# Patient Record
Sex: Female | Born: 1980 | Race: Black or African American | Hispanic: No | Marital: Single | Smoking: Never smoker
Health system: Southern US, Community
[De-identification: ages and names within clinical notes are randomized; demographics above are authoritative.]

## PROBLEM LIST (undated history)

## (undated) ENCOUNTER — Inpatient Hospital Stay (HOSPITAL_COMMUNITY): Payer: Self-pay

## (undated) DIAGNOSIS — G4730 Sleep apnea, unspecified: Secondary | ICD-10-CM

## (undated) DIAGNOSIS — E282 Polycystic ovarian syndrome: Secondary | ICD-10-CM

## (undated) DIAGNOSIS — O24419 Gestational diabetes mellitus in pregnancy, unspecified control: Secondary | ICD-10-CM

## (undated) DIAGNOSIS — K589 Irritable bowel syndrome without diarrhea: Secondary | ICD-10-CM

## (undated) DIAGNOSIS — O139 Gestational [pregnancy-induced] hypertension without significant proteinuria, unspecified trimester: Secondary | ICD-10-CM

## (undated) HISTORY — PX: DILATION AND CURETTAGE OF UTERUS: SHX78

---

## 2000-01-15 ENCOUNTER — Emergency Department (HOSPITAL_COMMUNITY): Admission: EM | Admit: 2000-01-15 | Discharge: 2000-01-15 | Payer: Self-pay | Admitting: Emergency Medicine

## 2001-06-21 ENCOUNTER — Emergency Department (HOSPITAL_COMMUNITY): Admission: EM | Admit: 2001-06-21 | Discharge: 2001-06-21 | Payer: Self-pay | Admitting: Emergency Medicine

## 2003-11-05 ENCOUNTER — Emergency Department (HOSPITAL_COMMUNITY): Admission: EM | Admit: 2003-11-05 | Discharge: 2003-11-05 | Payer: Self-pay | Admitting: Emergency Medicine

## 2004-01-12 ENCOUNTER — Emergency Department (HOSPITAL_COMMUNITY): Admission: EM | Admit: 2004-01-12 | Discharge: 2004-01-12 | Payer: Self-pay | Admitting: Emergency Medicine

## 2004-01-12 ENCOUNTER — Encounter: Payer: Self-pay | Admitting: Obstetrics

## 2004-02-24 ENCOUNTER — Emergency Department (HOSPITAL_COMMUNITY): Admission: EM | Admit: 2004-02-24 | Discharge: 2004-02-24 | Payer: Self-pay | Admitting: Emergency Medicine

## 2004-03-23 ENCOUNTER — Ambulatory Visit (HOSPITAL_COMMUNITY): Admission: RE | Admit: 2004-03-23 | Discharge: 2004-03-23 | Payer: Self-pay | Admitting: Obstetrics & Gynecology

## 2004-04-13 ENCOUNTER — Observation Stay (HOSPITAL_COMMUNITY): Admission: AD | Admit: 2004-04-13 | Discharge: 2004-04-14 | Payer: Self-pay | Admitting: Obstetrics & Gynecology

## 2004-05-23 ENCOUNTER — Inpatient Hospital Stay (HOSPITAL_COMMUNITY): Admission: AD | Admit: 2004-05-23 | Discharge: 2004-05-24 | Payer: Self-pay | Admitting: Obstetrics & Gynecology

## 2004-07-25 ENCOUNTER — Inpatient Hospital Stay (HOSPITAL_COMMUNITY): Admission: AD | Admit: 2004-07-25 | Discharge: 2004-07-25 | Payer: Self-pay | Admitting: Obstetrics

## 2004-08-04 ENCOUNTER — Inpatient Hospital Stay (HOSPITAL_COMMUNITY): Admission: AD | Admit: 2004-08-04 | Discharge: 2004-08-06 | Payer: Self-pay | Admitting: Obstetrics & Gynecology

## 2004-08-09 ENCOUNTER — Inpatient Hospital Stay (HOSPITAL_COMMUNITY): Admission: AD | Admit: 2004-08-09 | Discharge: 2004-08-19 | Payer: Self-pay | Admitting: Obstetrics & Gynecology

## 2004-08-10 ENCOUNTER — Encounter (INDEPENDENT_AMBULATORY_CARE_PROVIDER_SITE_OTHER): Payer: Self-pay | Admitting: Specialist

## 2006-07-18 ENCOUNTER — Inpatient Hospital Stay (HOSPITAL_COMMUNITY): Admission: AD | Admit: 2006-07-18 | Discharge: 2006-07-18 | Payer: Self-pay | Admitting: *Deleted

## 2006-07-20 ENCOUNTER — Ambulatory Visit (HOSPITAL_COMMUNITY): Admission: AD | Admit: 2006-07-20 | Discharge: 2006-07-20 | Payer: Self-pay | Admitting: Obstetrics and Gynecology

## 2006-07-20 ENCOUNTER — Encounter (INDEPENDENT_AMBULATORY_CARE_PROVIDER_SITE_OTHER): Payer: Self-pay | Admitting: Specialist

## 2007-03-01 ENCOUNTER — Emergency Department (HOSPITAL_COMMUNITY): Admission: EM | Admit: 2007-03-01 | Discharge: 2007-03-01 | Payer: Self-pay | Admitting: Emergency Medicine

## 2007-07-08 ENCOUNTER — Emergency Department (HOSPITAL_COMMUNITY): Admission: EM | Admit: 2007-07-08 | Discharge: 2007-07-08 | Payer: Self-pay | Admitting: Emergency Medicine

## 2007-10-22 ENCOUNTER — Inpatient Hospital Stay (HOSPITAL_COMMUNITY): Admission: AD | Admit: 2007-10-22 | Discharge: 2007-10-22 | Payer: Self-pay | Admitting: Obstetrics & Gynecology

## 2009-01-26 ENCOUNTER — Emergency Department (HOSPITAL_COMMUNITY): Admission: EM | Admit: 2009-01-26 | Discharge: 2009-01-26 | Payer: Self-pay | Admitting: Emergency Medicine

## 2009-02-20 ENCOUNTER — Ambulatory Visit: Payer: Self-pay | Admitting: Advanced Practice Midwife

## 2009-02-20 ENCOUNTER — Inpatient Hospital Stay (HOSPITAL_COMMUNITY): Admission: AD | Admit: 2009-02-20 | Discharge: 2009-02-20 | Payer: Self-pay | Admitting: Obstetrics & Gynecology

## 2009-02-27 ENCOUNTER — Ambulatory Visit (HOSPITAL_COMMUNITY): Admission: RE | Admit: 2009-02-27 | Discharge: 2009-02-27 | Payer: Self-pay | Admitting: Obstetrics & Gynecology

## 2009-03-02 ENCOUNTER — Inpatient Hospital Stay (HOSPITAL_COMMUNITY): Admission: AD | Admit: 2009-03-02 | Discharge: 2009-03-02 | Payer: Self-pay | Admitting: Obstetrics and Gynecology

## 2009-05-25 ENCOUNTER — Inpatient Hospital Stay (HOSPITAL_COMMUNITY): Admission: AD | Admit: 2009-05-25 | Discharge: 2009-05-25 | Payer: Self-pay | Admitting: Obstetrics & Gynecology

## 2009-07-10 ENCOUNTER — Emergency Department (HOSPITAL_COMMUNITY): Admission: AC | Admit: 2009-07-10 | Discharge: 2009-07-10 | Payer: Self-pay

## 2009-07-13 ENCOUNTER — Inpatient Hospital Stay (HOSPITAL_COMMUNITY): Admission: AD | Admit: 2009-07-13 | Discharge: 2009-07-13 | Payer: Self-pay | Admitting: Obstetrics and Gynecology

## 2009-07-22 ENCOUNTER — Encounter: Admission: RE | Admit: 2009-07-22 | Discharge: 2009-10-20 | Payer: Self-pay | Admitting: Obstetrics and Gynecology

## 2009-08-11 ENCOUNTER — Encounter: Admission: RE | Admit: 2009-08-11 | Discharge: 2009-09-10 | Payer: Self-pay | Admitting: Obstetrics and Gynecology

## 2009-08-28 ENCOUNTER — Inpatient Hospital Stay (HOSPITAL_COMMUNITY): Admission: AD | Admit: 2009-08-28 | Discharge: 2009-08-28 | Payer: Self-pay | Admitting: Obstetrics and Gynecology

## 2009-08-29 ENCOUNTER — Emergency Department (HOSPITAL_COMMUNITY): Admission: EM | Admit: 2009-08-29 | Discharge: 2009-08-29 | Payer: Self-pay | Admitting: Emergency Medicine

## 2009-10-01 ENCOUNTER — Inpatient Hospital Stay (HOSPITAL_COMMUNITY): Admission: AD | Admit: 2009-10-01 | Discharge: 2009-10-01 | Payer: Self-pay | Admitting: Obstetrics and Gynecology

## 2009-10-13 ENCOUNTER — Inpatient Hospital Stay (HOSPITAL_COMMUNITY): Admission: RE | Admit: 2009-10-13 | Discharge: 2009-10-16 | Payer: Self-pay | Admitting: Obstetrics and Gynecology

## 2010-06-05 LAB — CBC
Hemoglobin: 9.6 g/dL — ABNORMAL LOW (ref 12.0–15.0)
MCH: 27.4 pg (ref 26.0–34.0)
MCHC: 32.8 g/dL (ref 30.0–36.0)
Platelets: 202 10*3/uL (ref 150–400)
RBC: 4.17 MIL/uL (ref 3.87–5.11)
RDW: 15.9 % — ABNORMAL HIGH (ref 11.5–15.5)
WBC: 5.4 10*3/uL (ref 4.0–10.5)

## 2010-06-05 LAB — GLUCOSE, CAPILLARY
Glucose-Capillary: 110 mg/dL — ABNORMAL HIGH (ref 70–99)
Glucose-Capillary: 90 mg/dL (ref 70–99)

## 2010-06-05 LAB — BASIC METABOLIC PANEL
Chloride: 104 mEq/L (ref 96–112)
Glucose, Bld: 117 mg/dL — ABNORMAL HIGH (ref 70–99)
Sodium: 132 mEq/L — ABNORMAL LOW (ref 135–145)

## 2010-06-05 LAB — RPR: RPR Ser Ql: NONREACTIVE

## 2010-06-05 LAB — SURGICAL PCR SCREEN: MRSA, PCR: NEGATIVE

## 2010-06-06 LAB — URINALYSIS, ROUTINE W REFLEX MICROSCOPIC
Bilirubin Urine: NEGATIVE
Ketones, ur: NEGATIVE mg/dL
Nitrite: NEGATIVE
Protein, ur: NEGATIVE mg/dL
pH: 6.5 (ref 5.0–8.0)

## 2010-06-07 LAB — STREP A DNA PROBE

## 2010-06-07 LAB — GLUCOSE, CAPILLARY: Glucose-Capillary: 101 mg/dL — ABNORMAL HIGH (ref 70–99)

## 2010-06-07 LAB — CBC
HCT: 30.5 % — ABNORMAL LOW (ref 36.0–46.0)
Platelets: 217 10*3/uL (ref 150–400)
RBC: 3.53 MIL/uL — ABNORMAL LOW (ref 3.87–5.11)
WBC: 8.3 10*3/uL (ref 4.0–10.5)

## 2010-06-07 LAB — URINALYSIS, ROUTINE W REFLEX MICROSCOPIC
Bilirubin Urine: NEGATIVE
Glucose, UA: 250 mg/dL — AB
Hgb urine dipstick: NEGATIVE
Protein, ur: NEGATIVE mg/dL

## 2010-06-08 LAB — COMPREHENSIVE METABOLIC PANEL
ALT: 17 U/L (ref 0–35)
AST: 36 U/L (ref 0–37)
Albumin: 3 g/dL — ABNORMAL LOW (ref 3.5–5.2)
Alkaline Phosphatase: 63 U/L (ref 39–117)
Chloride: 110 mEq/L (ref 96–112)
GFR calc Af Amer: >60 mL/min (ref >60)
Potassium: 4.6 mEq/L (ref 3.5–5.1)
Sodium: 138 mEq/L (ref 135–145)
Total Bilirubin: 0.5 mg/dL (ref 0.3–1.2)

## 2010-06-08 LAB — URINALYSIS, ROUTINE W REFLEX MICROSCOPIC
Bilirubin Urine: NEGATIVE
Glucose, UA: 500 mg/dL — AB
Hgb urine dipstick: NEGATIVE
Hgb urine dipstick: NEGATIVE
Protein, ur: NEGATIVE mg/dL
Specific Gravity, Urine: 1.014 (ref 1.005–1.030)
Urobilinogen, UA: 1 mg/dL (ref 0.0–1.0)

## 2010-06-08 LAB — CBC
Platelets: 215 10*3/uL (ref 150–400)
WBC: 8.4 10*3/uL (ref 4.0–10.5)

## 2010-06-08 LAB — DIFFERENTIAL
Basophils Absolute: 0 10*3/uL (ref 0.0–0.1)
Basophils Relative: 0 % (ref 0–1)
Eosinophils Absolute: 0.1 10*3/uL (ref 0.0–0.7)
Eosinophils Relative: 1 % (ref 0–5)
Lymphocytes Relative: 22 % (ref 12–46)
Monocytes Absolute: 0.7 10*3/uL (ref 0.1–1.0)

## 2010-06-13 LAB — URINE CULTURE

## 2010-06-13 LAB — URINALYSIS, ROUTINE W REFLEX MICROSCOPIC
Glucose, UA: NEGATIVE mg/dL
Ketones, ur: 15 mg/dL — AB
pH: 6.5 (ref 5.0–8.0)

## 2010-06-13 LAB — WET PREP, GENITAL
Trich, Wet Prep: NONE SEEN
Yeast Wet Prep HPF POC: NONE SEEN

## 2010-06-13 LAB — URINE MICROSCOPIC-ADD ON

## 2010-06-22 LAB — URINALYSIS, ROUTINE W REFLEX MICROSCOPIC
Bilirubin Urine: NEGATIVE
Ketones, ur: NEGATIVE mg/dL
Nitrite: NEGATIVE
Nitrite: NEGATIVE
Protein, ur: NEGATIVE mg/dL
Specific Gravity, Urine: 1.02 (ref 1.005–1.030)
Urobilinogen, UA: 0.2 mg/dL (ref 0.0–1.0)
pH: 6.5 (ref 5.0–8.0)

## 2010-06-22 LAB — URINE CULTURE: Colony Count: >=100000

## 2010-06-22 LAB — WET PREP, GENITAL
Clue Cells Wet Prep HPF POC: NONE SEEN
Trich, Wet Prep: NONE SEEN

## 2010-06-22 LAB — POCT PREGNANCY, URINE: Preg Test, Ur: POSITIVE

## 2010-06-22 LAB — GC/CHLAMYDIA PROBE AMP, GENITAL
Chlamydia, DNA Probe: NEGATIVE
GC Probe Amp, Genital: NEGATIVE

## 2010-06-22 LAB — URINE MICROSCOPIC-ADD ON
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
## 2010-08-06 NOTE — Discharge Summary (Signed)
Rachael Wilson, Rachael Wilson                ACCOUNT NO.:  [id number]   MEDICAL RECORD NO.:  [id number]          PATIENT TYPE:  INP   LOCATION:  9119                          [facility]ILITY:  [facility]   PHYSICIAN:  Charles A. Clearance Coots, M.D.DATE OF BIRTH:  [birth year]   DATE OF ADMISSION:  08/09/2004  DATE OF DISCHARGE:                                 DISCHARGE SUMMARY   ADMITTING DIAGNOSES:  1.  Forty-plus weeks gestation.  2.  Induction of labor.  3.  Post dates.   DISCHARGE DIAGNOSES:  1.  Forty-plus weeks gestation.  2.  Induction of labor.  3.  Post dates.  4.  Status post primary low transverse cesarean section on Aug 10, 2004 for      arrest of descent (deep transverse arrest). Delivered a viable female at      2239, Apgars of 8 at one minute and 9 at five minutes, weight of 9      pounds 7 ounces.  5.  Postpartum preeclampsia.   Mother and infant discharged home in good condition.   REASON FOR ADMISSION:  A 30 year old black female para 0 with estimated date  of confinement of Aug 06, 2004 admitted to [facility] for induction of  labor for post dates.   PAST MEDICAL HISTORY:  Surgery:  None. Illnesses:  Urinary tract infections,  meningitis.   MEDICATIONS:  Prenatal vitamins.   ALLERGIES:  BACTRIM causes an anaphylactic reaction.   SOCIAL HISTORY:  Single. Negative tobacco, alcohol, or recreational drug  use.   PHYSICAL EXAMINATION:  GENERAL:  Well-nourished, well-developed black female  in no acute distress.  VITAL SIGNS:  Temperature 98.4, pulse 81, respiratory rate 18, blood  pressure 115/80.  ABDOMEN:  Gravid, nontender.  PELVIC:  Cervix long, closed, and vertex at -2 station.   ADMITTING LABORATORY VALUES:  Hemoglobin 12.4; hematocrit 37.2; white blood  cell count 12,400; platelets 206,000. Comprehensive metabolic panel was  within normal limits except for a creatinine of 1.5.   HOSPITAL COURSE:  The patient was admitted and started on cervical ripening  overnight, and  on the following morning on exam the cervix was 4 cm, 80%  effaced, and the vertex at a -2 station. The patient was given epidural for  pain management and Pitocin augmentation was started. The patient progressed  to full dilatation of the cervix and the position was occiput transverse.  She continued to develop increased caput but no further progress in descent  past about +1 to +2 station which was mostly caput. Pitocin was increased  more to help with rotation of the vertex into a more favorable occiput  anterior position but the position persisted at left occiput transverse and  the patient developed increased caput, and after greater than 3-4 hours of  second stage of labor, a decision was made to proceed with cesarean section  delivery for arrest of descent with deep occiput transverse arrest. Primary  low transverse cesarean section was performed without complications.  Postpartum and postoperative course was complicated by increased blood  pressure postoperative day #3. Preeclampsia labs were drawn and the patient  was started on magnesium sulfate. She had a very slow response to resolution  of her blood pressures and antihypertensives were started with labetalol  followed by clonidine, along with hydrochlorothiazide. The patient's blood  pressures slowly resolved and by postoperative day #5 blood pressures were  almost normal. She continued to have a resolution of her blood pressures and  by postoperative day #6 had normal blood pressures. She was taken off of  magnesium sulfate and continued to have normal blood pressures in  postoperative day #7, and was therefore discharged home on p.o.  antihypertensive medication.   DISCHARGE LABORATORY VALUES:  Hemoglobin 8.7; hematocrit 25.8; white blood  cell count 7000; platelets 291,000. Comprehensive metabolic panel was within  normal limits with creatinine of 1.0.   DISCHARGE DISPOSITION:  1.  Medications:  Clonidine 0.1 mg p.o.  twice a day, hydrochlorothiazide 25      mg p.o. daily, ibuprofen was prescribed for pain. The patient is to      continue prenatal vitamins. She should also take a stool softener and      Dulcolax as needed for constipation.  2.  Routine written instructions were given for obstetrical discharge after      cesarean section.  3.  She is to call the office for a follow-up appointment for a blood      pressure check in 1 week.      CAH/MEDQ  D:  08/19/2004  T:  08/19/2004  Job:  [id number]

## 2010-08-06 NOTE — Op Note (Signed)
Rachael Wilson, Rachael Wilson                ACCOUNT NO.:  [id number]   MEDICAL RECORD NO.:  [id number]          PATIENT TYPE:  INP   LOCATION:  9131                          [facility]ACILITY:  [facility]   PHYSICIAN:  Charles A. Clearance Coots, M.D.DATE OF BIRTH:  July 18, 1980   DATE OF PROCEDURE:  08/10/2004  DATE OF DISCHARGE:                                 OPERATIVE REPORT   PREOPERATIVE DIAGNOSIS:  Arrest of descent, deep transverse arrest.   POSTOPERATIVE DIAGNOSIS:  Arrest of descent, deep transverse arrest.   PROCEDURE:  Primary low transverse cesarean section.   SURGEON:  Coral Ceo, M.D.   ASSISTANT:  Delcie Roch, OR technician.   ANESTHESIA:  Epidural.   ESTIMATED BLOOD LOSS:  800 mL.   IV FLUIDS:  2650 mL.   URINE OUTPUT:  250 mL.   COMPLICATIONS:  None.   DRAINS:  Foley to gravity.   FINDINGS:  Viable female at 2239.  Apgars of 8 at 1 minute, 9 at 5 minutes,  weight of 9 pounds 7 ounces. Normal uterus, ovaries and fallopian tube.   OPERATION:  The patient was brought to operating room and after satisfactory  redosing of the epidural, the abdomen was prepped and draped in the usual  sterile fashion. Pfannenstiel's skin incision was made with a scalpel that  was deepened down to the fascia with a scalpel. Fascia was nicked in the  midline and fascial incision was extended to the left to right with curved  Mayo scissors. The superior and inferior fascial edges were taken off the  rectus muscle with blunt sharp dissection. The rectus muscle was divided in  the midline both sharply and bluntly and the peritoneum was entered  digitally and was digitally extended to left and to the right. Bladder blade  was positioned. The vesicouterine fold of peritoneum above the reflection of  the urinary bladder was grasped forceps and was incised and undermined with  Metzenbaum scissors. The incision was extended to left and to right with the  Metzenbaum scissors. Bladder flap was then bluntly developed  and the bladder  blade was repositioned in front of the urinary bladder placing it well out  of operative field. The uterus was then entered in the lower uterine segment  transversely with a scalpel and the light meconium stained fluid was  expelled. The uterine incision was then extended to the left and to right  with the bandage scissors. The vertex was noted to be left occiput  transverse and the occiput was rotated into the incision and the vertex was  delivered with the aid of fundal pressure from the assistant. Infant's mouth  and nose were suctioned with a suction bulb and delivery was completed with  the aid of fundal pressure from the assistant. The umbilical cord was doubly  clamped and cut and the infant was handed off to the nursery staff. Cord  blood was obtained and the placenta was spontaneously expelled from the  uterine cavity intact. Endometrial surface was then debrided with a dry lap  sponge. Edges of the uterine incision were grasped with ring forceps and the  uterus was closed with a continuous interlocking layer of 0 Monocryl from  each corner to the center. Hemostasis was excellent. Pelvic cavity was  thoroughly irrigated with warm saline solution and all clots were removed.  The abdomen was then closed as follows. Peritoneum was closed with  continuous suture of 2-0 Monocryl. Fascia was closed with continuous suture  of 0 PDS from each corner to the center. The subcutaneous tissue was  thoroughly irrigated with warm saline solution and all areas of subcutaneous  bleeding were coagulated with Bovie. Skin was then approximated with  surgical stainless steel staples. Sterile bandage was applied to the  incision closure. Surgical technician indicated that all needle, sponge and  instrument counts were correct. The patient tolerated procedure well and  transported to recovery room in satisfactory condition.      CAH/MEDQ  D:  08/10/2004  T:  08/11/2004  Job:  [id number]

## 2010-08-06 NOTE — Op Note (Signed)
NAMEMARLANE, HIRSCHMANN                ACCOUNT NO.:  [id number]   MEDICAL RECORD NO.:  [id number]          PATIENT TYPE:  AMB   LOCATION:  SDC                           [facility]ACILITY:  [facility]   PHYSICIAN:  Maxie Better, M.D.DATE OF BIRTH:  09/24/1980   DATE OF PROCEDURE:  07/20/2006  DATE OF DISCHARGE:                               OPERATIVE REPORT   PREOPERATIVE DIAGNOSIS:  Missed abortion, heavy vaginal  bleeding/incomplete abortion.   PROCEDURE:  Suction dilation and evacuation.   POSTOPERATIVE DIAGNOSIS:  Incomplete abortion.   ANESTHESIA:  MAC, paracervical block.   SURGEON:  Maxie Better, M.D.   PROCEDURE:  Under adequate monitored anesthesia, the patient was placed  in the dorsal lithotomy position.  She was sterilely prepped and draped  in usual fashion.  Bladder was catheterized for moderate amount of  urine.  On prepping the patient, a large amount of clot was removed from  the vagina.  A bivalve speculum subsequently placed.  Twenty mL of 1%  Nesacaine was injected paracervically at 3 and 9 o'clock.  The anterior  lip of the cervix was grasped with a single-tooth tenaculum.  The cervix  was already a centimeter dilated, and on exam, the uterus was axial  about 8-week size.  A #8-mm curved suction cannula was introduced into  the uterine cavity.  Moderate amount of products of conception was  obtained.  The cavity was suction curetted and suctioned until all  tissue was felt to have been removed, at which time all instruments were  then removed from the vagina.  Specimen labeled products of conception  was sent to pathology.  Estimated blood loss was 100 mL, included the  clotted material from the vagina.  Maternal blood type is A+.  Complication was none.  The patient tolerated the procedure well and was  transferred to the recovery room in stable condition.      Maxie Better, M.D.  Electronically Signed     [place]/MEDQ  D:  07/20/2006  T:  07/20/2006  Job:   [id number]

## 2010-12-17 LAB — GC/CHLAMYDIA PROBE AMP, GENITAL
Chlamydia, DNA Probe: NEGATIVE
GC Probe Amp, Genital: NEGATIVE

## 2010-12-17 LAB — CBC
HCT: 35.9 — ABNORMAL LOW
Hemoglobin: 12
MCHC: 33.4
RBC: 4.05

## 2010-12-17 LAB — WET PREP, GENITAL: Clue Cells Wet Prep HPF POC: NONE SEEN

## 2010-12-27 LAB — BASIC METABOLIC PANEL
BUN: 7
Calcium: 9.2
Creatinine, Ser: 0.75
GFR calc non Af Amer: >60
Glucose, Bld: 104 — ABNORMAL HIGH

## 2010-12-27 LAB — URINE MICROSCOPIC-ADD ON

## 2010-12-27 LAB — DIFFERENTIAL
Basophils Absolute: 0.1
Eosinophils Relative: 1
Lymphocytes Relative: 26
Neutro Abs: 6.9
Neutrophils Relative %: 67

## 2010-12-27 LAB — CBC
Platelets: 359
RDW: 13.7
WBC: 10.3

## 2010-12-27 LAB — URINALYSIS, ROUTINE W REFLEX MICROSCOPIC
Bilirubin Urine: NEGATIVE
Glucose, UA: NEGATIVE
Protein, ur: 30 — AB
Specific Gravity, Urine: 1.033 — ABNORMAL HIGH

## 2010-12-27 LAB — PREGNANCY, URINE: Preg Test, Ur: NEGATIVE

## 2010-12-29 DIAGNOSIS — A568 Sexually transmitted chlamydial infection of other sites: Secondary | ICD-10-CM | POA: Insufficient documentation

## 2011-01-08 ENCOUNTER — Encounter (HOSPITAL_COMMUNITY): Payer: Self-pay | Admitting: *Deleted

## 2011-01-08 ENCOUNTER — Inpatient Hospital Stay (HOSPITAL_COMMUNITY)
Admission: AD | Admit: 2011-01-08 | Discharge: 2011-01-09 | Disposition: A | Discharge status: Home / Self Care | Payer: 59 | Source: Ambulatory Visit | Attending: Obstetrics and Gynecology | Admitting: Obstetrics and Gynecology

## 2011-01-08 DIAGNOSIS — O99891 Other specified diseases and conditions complicating pregnancy: Secondary | ICD-10-CM | POA: Insufficient documentation

## 2011-01-08 DIAGNOSIS — O210 Mild hyperemesis gravidarum: Secondary | ICD-10-CM | POA: Insufficient documentation

## 2011-01-08 DIAGNOSIS — R109 Unspecified abdominal pain: Secondary | ICD-10-CM | POA: Insufficient documentation

## 2011-01-08 DIAGNOSIS — Z331 Pregnant state, incidental: Secondary | ICD-10-CM

## 2011-01-08 DIAGNOSIS — J069 Acute upper respiratory infection, unspecified: Secondary | ICD-10-CM | POA: Insufficient documentation

## 2011-01-08 HISTORY — DX: Polycystic ovarian syndrome: E28.2

## 2011-01-08 HISTORY — DX: Irritable bowel syndrome, unspecified: K58.9

## 2011-01-08 LAB — ABO/RH: ABO/RH(D): A POS

## 2011-01-08 LAB — URINALYSIS, ROUTINE W REFLEX MICROSCOPIC
Ketones, ur: NEGATIVE mg/dL
Leukocytes, UA: NEGATIVE
Nitrite: NEGATIVE
Specific Gravity, Urine: 1.02 (ref 1.005–1.030)
pH: 7 (ref 5.0–8.0)

## 2011-01-08 LAB — POCT PREGNANCY, URINE: Preg Test, Ur: POSITIVE

## 2011-01-08 NOTE — ED Notes (Signed)
2340Nona Smith CNM in to see pt

## 2011-01-08 NOTE — Progress Notes (Signed)
Pt states, " I've had cramping in my entire abdomen like bad gas. It was off and on for a month, but worse for a week. I have a baby and breast feeding 2 months ago."

## 2011-01-08 NOTE — Progress Notes (Signed)
Rachael Wilson cNM  Notified of pt's admission and status. Aware of positive upt tonight. Will see pt when finished in BS

## 2011-01-08 NOTE — ED Notes (Signed)
Rachael Wilson CNM called unit and will do blood work on pt and will be down to see pt after finishing up delivery in BS. Pt aware.

## 2011-01-09 ENCOUNTER — Inpatient Hospital Stay (HOSPITAL_COMMUNITY): Payer: 59

## 2011-01-09 LAB — WET PREP, GENITAL: Trich, Wet Prep: NONE SEEN

## 2011-01-09 MED ORDER — PRENATAL PLUS 27-1 MG PO TABS: 1.0000 | ORAL_TABLET | Freq: Every day | ORAL | Status: DC

## 2011-01-09 MED ORDER — PROMETHAZINE HCL 25 MG PO TABS: 25.0000 mg | ORAL_TABLET | Freq: Four times a day (QID) | ORAL | Status: AC | PRN

## 2011-01-09 NOTE — Progress Notes (Signed)
Pt talking and laughing with friend at bedside

## 2011-01-09 NOTE — Progress Notes (Signed)
Elsie Ra CNM in to see pt. Spec exam done and wet prep and GC/Chlam obtained. Pt tol well.

## 2011-01-09 NOTE — ED Provider Notes (Signed)
History     Chief Complaint  Patient presents with  . Abdominal Cramping   HPI Pt presents with a hx of mild lower abdominal cramping over the past 2-3 days.  She also complains of nausea, vomiting, fatigue and some recent diarrhea.  She also is experiencing URI symptoms over the past 2-3 days but denies fever. She denies any vaginal bleeding.  She is unsure but thinks her LMP was 11/10/10.  She reports irreg menses due to PCOS.  She has not treated her cramping with any medication.  She denies any fever.  She does report she did a preg test at home that was positive.  She was not using any form of contraception.   OB History    Grav Para Term Preterm Abortions TAB SAB Ect Mult Living   4 2 2  0 1 0 1 0 0 2      Past Medical History  Diagnosis Date  . Polycystic ovarian syndrome   . IBS (irritable bowel syndrome)     Past Surgical History  Procedure Date  . Cesarean section   . Dilation and curettage of uterus     Family History  Problem Relation Age of Onset  . Diabetes Mother   . Diabetes Maternal Grandmother   . Cancer Maternal Grandmother   . Diabetes Maternal Grandfather   . Diabetes Paternal Grandmother   . Diabetes Paternal Grandfather     History  Substance Use Topics  . Smoking status: Not on file  . Smokeless tobacco: Not on file  . Alcohol Use: 0.0 oz/week    1-2 Glasses of wine per week     occ. drink    Allergies:  Allergies  Allergen Reactions  . Bactrim Other (See Comments)    Patient states that she gets blisters from this medication.  . Sulfa Antibiotics Other (See Comments)    Patient states that she gets blisters from this medication.    Prescriptions prior to admission  Medication Sig Dispense Refill  . dextromethorphan-guaiFENesin (MUCINEX DM) 30-600 MG per 12 hr tablet Take 1 tablet by mouth every 12 (twelve) hours. Patient has been using this medication for sinuses.       Marland Kitchen loratadine (CLARITIN) 10 MG tablet Take 10 mg by mouth daily.  Patient is using this medication for allergies.       Marland Kitchen DISCONTD: ibuprofen (ADVIL,MOTRIN) 400 MG tablet Take 400 mg by mouth every 6 (six) hours as needed. Patient used this medication for pain.       Marland Kitchen DISCONTD: prenatal vitamin w/FE, FA (PRENATAL 1 + 1) 27-1 MG TABS Take 1 tablet by mouth daily.          Review of Systems  Constitutional: Positive for malaise/fatigue. Negative for fever, chills, weight loss and diaphoresis.  HENT: Positive for congestion. Negative for hearing loss, ear pain, nosebleeds, sore throat, tinnitus and ear discharge.   Eyes: Negative.   Respiratory: Positive for cough. Negative for hemoptysis, sputum production, shortness of breath, wheezing and stridor.   Cardiovascular: Negative.   Gastrointestinal: Positive for nausea, vomiting, abdominal pain and diarrhea. Negative for heartburn, constipation, blood in stool and melena.  Genitourinary: Negative.   Musculoskeletal: Negative.   Skin: Negative.   Neurological: Negative.  Negative for weakness and headaches.  Endo/Heme/Allergies: Negative.   Psychiatric/Behavioral: Negative.    Physical Exam   Blood pressure 111/63, pulse 82, temperature 98.3 F (36.8 C), temperature source Oral, resp. rate 20, height 5' 3.25" (1.607 m), weight 82.781 kg (182  lb 8 oz), last menstrual period 11/10/2010.  Physical Exam  Constitutional: She is oriented to person, place, and time. She appears well-developed and well-nourished.  HENT:  Head: Normocephalic and atraumatic.  Right Ear: External ear normal.  Left Ear: External ear normal.  Eyes: Conjunctivae are normal. Pupils are equal, round, and reactive to light.  Neck: Normal range of motion. Neck supple. No thyromegaly present.  Cardiovascular: Normal rate and regular rhythm.   Respiratory: Effort normal and breath sounds normal.  GI: Soft. Bowel sounds are normal.  Genitourinary: Vagina normal and uterus normal.       Vagina with sm white d/c present.  Cx without  lesions or exudate. Neg CMT. Ut retroverted approx 8wks size, mobile and non-tender.  Adnexa without masses or tenderness.  Musculoskeletal: Normal range of motion.       Neg CVAT bilat  Neurological: She is alert and oriented to person, place, and time. She has normal reflexes.  Skin: Skin is warm and dry.  Psychiatric: She has a normal mood and affect. Her behavior is normal. Judgment and thought content normal.    MAU Course  Procedures Results for orders placed during the hospital encounter of 01/08/11 (from the past 24 hour(s))  URINALYSIS, ROUTINE W REFLEX MICROSCOPIC     Status: Normal   Collection Time   01/08/11  9:00 PM      Component Value Range   Color, Urine YELLOW  YELLOW    Appearance CLEAR  CLEAR    Specific Gravity, Urine 1.020  1.005 - 1.030    pH 7.0  5.0 - 8.0    Glucose, UA NEGATIVE  NEGATIVE (mg/dL)   Hgb urine dipstick NEGATIVE  NEGATIVE    Bilirubin Urine NEGATIVE  NEGATIVE    Ketones, ur NEGATIVE  NEGATIVE (mg/dL)   Protein, ur NEGATIVE  NEGATIVE (mg/dL)   Urobilinogen, UA 0.2  0.0 - 1.0 (mg/dL)   Nitrite NEGATIVE  NEGATIVE    Leukocytes, UA NEGATIVE  NEGATIVE   POCT PREGNANCY, URINE     Status: Normal   Collection Time   01/08/11  9:10 PM      Component Value Range   Preg Test, Ur POSITIVE    HCG, QUANTITATIVE, PREGNANCY     Status: Abnormal   Collection Time   01/08/11 10:28 PM      Component Value Range   hCG, Beta Chain, Quant, S 94511 (*) <5 (mIU/mL)  ABO/RH     Status: Normal   Collection Time   01/08/11 10:28 PM      Component Value Range   ABO/RH(D) A POS    WET PREP, GENITAL     Status: Abnormal   Collection Time   01/08/11 11:55 PM      Component Value Range   Yeast, Wet Prep NONE SEEN  NONE SEEN    Trich, Wet Prep NONE SEEN  NONE SEEN    Clue Cells, Wet Prep NONE SEEN  NONE SEEN    WBC, Wet Prep HPF POC FEW (*) NONE SEEN    GC/Chl-pending  Ultrasound:  IUP c/w 8w 5d with FHTs present.  No subchorionic hemorrhage noted.  See [place]  report.    Assessment and Plan  IUP at 8w 5d Abdominal cramping Hx IBS URI Nausea and vomiting  [place] results reviewed with pt.   Abd cramping d/w pt and d/w pt may be related more to GI issues with hx IBS and recent diarrhea.  Relief measures d/w pt and enc probiotics. Rx  phenergan 25mg  tabs, # 30, no refill, 1 tab po q6hrs prn nausea given.  Relief measures for nausea d/w pt. Rx prenatal vitamins given.  Pt to call [facility] on Mon 01/10/11 to sched appt for NOB visit. Warning signs/symptoms discussed with pt.  Kamaree Wheatley O. 01/09/2011, 1:43 AM

## 2011-01-09 NOTE — Progress Notes (Signed)
Written and verbal d/c instructions given and understanding voiced. 

## 2011-01-09 NOTE — ED Notes (Signed)
Elsie Ra CNM in to see pt and discuss u/s results and d/c home

## 2011-02-08 ENCOUNTER — Encounter (HOSPITAL_COMMUNITY): Payer: Self-pay

## 2011-03-04 LAB — OB RESULTS CONSOLE ABO/RH: RH Type: POSITIVE

## 2011-03-04 LAB — CBC
HCT: 37 % (ref 36–46)
Platelets: 242 10*3/uL (ref 150–399)

## 2011-03-04 LAB — OB RESULTS CONSOLE RPR: RPR: NONREACTIVE

## 2011-03-04 LAB — OB RESULTS CONSOLE HIV ANTIBODY (ROUTINE TESTING): HIV: NONREACTIVE

## 2011-03-04 LAB — OB RESULTS CONSOLE ANTIBODY SCREEN: Antibody Screen: POSITIVE

## 2011-03-11 ENCOUNTER — Inpatient Hospital Stay (HOSPITAL_COMMUNITY)
Admission: AD | Admit: 2011-03-11 | Discharge: 2011-03-11 | Disposition: A | Discharge status: Home / Self Care | Payer: Medicaid Other | Source: Ambulatory Visit | Attending: Obstetrics and Gynecology | Admitting: Obstetrics and Gynecology

## 2011-03-11 ENCOUNTER — Encounter (HOSPITAL_COMMUNITY): Payer: Self-pay | Admitting: *Deleted

## 2011-03-11 DIAGNOSIS — M549 Dorsalgia, unspecified: Secondary | ICD-10-CM | POA: Insufficient documentation

## 2011-03-11 DIAGNOSIS — O99891 Other specified diseases and conditions complicating pregnancy: Secondary | ICD-10-CM | POA: Insufficient documentation

## 2011-03-11 DIAGNOSIS — R109 Unspecified abdominal pain: Secondary | ICD-10-CM | POA: Insufficient documentation

## 2011-03-11 LAB — URINALYSIS, ROUTINE W REFLEX MICROSCOPIC
Bilirubin Urine: NEGATIVE
Glucose, UA: 100 mg/dL — AB
Leukocytes, UA: NEGATIVE
Protein, ur: NEGATIVE mg/dL
Specific Gravity, Urine: 1.015 (ref 1.005–1.030)
Urobilinogen, UA: 0.2 mg/dL (ref 0.0–1.0)

## 2011-03-11 MED ORDER — CYCLOBENZAPRINE HCL 10 MG PO TABS
10.0000 mg | ORAL_TABLET | Freq: Three times a day (TID) | ORAL | Status: AC | PRN
Start: 2011-03-11 — End: 2011-03-21

## 2011-03-11 MED ORDER — ACETAMINOPHEN 325 MG PO TABS
650.0000 mg | ORAL_TABLET | Freq: Once | ORAL | Status: AC
  Administered 2011-03-11: 650 mg via ORAL
  Filled 2011-03-11: qty 2

## 2011-03-11 NOTE — Progress Notes (Signed)
30 yo g3p2 [facility] 5/30 17 1/7 week IUP presents with c/o of backache to lower back x 2 months hx of backache with prior pg and pt referral, denies vag bleeding, srom, no fm yet O VSS      Fhts +     16 week fundus      abd soft, gravid, nt      Neg CVAT bilaterally      UA neg A hx back pain    Back pain now    Hx GDM     Hx C/S     17 week IUP P PT referral, RX flexeril discussed, exercises for back pain, ice, heat discussed. F/O with US anatomy 2 weeks. Lavera Guise, CNM

## 2011-03-11 NOTE — Progress Notes (Signed)
Drained, fatigued, weak for past week, lower abdominal pain and back pain for a few days, no vaginal bleeding, feels pressure, no pain with urination, 17 weeks.

## 2011-03-11 NOTE — ED Notes (Signed)
Paged Lavera Guise X 2 without response. Asked Pamelia Hoit NP to do screaning exam.

## 2011-03-11 NOTE — ED Notes (Signed)
Melina Fiddler CNM paged overhead by operator to MAU

## 2011-03-11 NOTE — ED Notes (Signed)
Mayer Masker CNM in room with pt

## 2011-03-23 ENCOUNTER — Inpatient Hospital Stay (HOSPITAL_COMMUNITY)
Admission: AD | Admit: 2011-03-23 | Discharge: 2011-03-23 | Disposition: A | Discharge status: Home / Self Care | Payer: Medicaid Other | Source: Ambulatory Visit | Attending: Obstetrics and Gynecology | Admitting: Obstetrics and Gynecology

## 2011-03-23 ENCOUNTER — Encounter (HOSPITAL_COMMUNITY): Payer: Self-pay | Admitting: *Deleted

## 2011-03-23 DIAGNOSIS — O368190 Decreased fetal movements, unspecified trimester, not applicable or unspecified: Secondary | ICD-10-CM | POA: Insufficient documentation

## 2011-03-23 DIAGNOSIS — O99891 Other specified diseases and conditions complicating pregnancy: Secondary | ICD-10-CM | POA: Insufficient documentation

## 2011-03-23 DIAGNOSIS — K5900 Constipation, unspecified: Secondary | ICD-10-CM | POA: Insufficient documentation

## 2011-03-23 DIAGNOSIS — N949 Unspecified condition associated with female genital organs and menstrual cycle: Secondary | ICD-10-CM

## 2011-03-23 DIAGNOSIS — R109 Unspecified abdominal pain: Secondary | ICD-10-CM | POA: Insufficient documentation

## 2011-03-23 LAB — URINALYSIS, ROUTINE W REFLEX MICROSCOPIC
Hgb urine dipstick: NEGATIVE
Ketones, ur: NEGATIVE mg/dL
Protein, ur: NEGATIVE mg/dL
Urobilinogen, UA: 0.2 mg/dL (ref 0.0–1.0)

## 2011-03-23 LAB — WET PREP, GENITAL
Clue Cells Wet Prep HPF POC: NONE SEEN
Trich, Wet Prep: NONE SEEN
Yeast Wet Prep HPF POC: NONE SEEN

## 2011-03-23 MED ORDER — IBUPROFEN 600 MG PO TABS
600.0000 mg | ORAL_TABLET | Freq: Four times a day (QID) | ORAL | Status: DC | PRN
  Administered 2011-03-23: 600 mg via ORAL
  Filled 2011-03-23: qty 1

## 2011-03-23 MED ORDER — IBUPROFEN 600 MG PO TABS: 600.0000 mg | ORAL_TABLET | Freq: Four times a day (QID) | ORAL | Status: AC | PRN

## 2011-03-23 NOTE — ED Provider Notes (Signed)
History   31 yo [id number] at 4 6/7 weeks presented unannounced c/o inability to feel FM, pelvic pain, and anxiety regarding status of pregnancy.  Patient has NOB visit and [place] scheduled at [facility] 03/30/11.  Has been seen in MAU several times, with [place] at 8 weeks for dating and viability.  Reports small amount d/c, no dysuria or bleeding.  Does report constipation, with BMs q 2-3 days.  Denies nausea and vomiting.  Pregnancy remarkable for: PCOS Previous C/S IBS Allergies to Bactrim, Sulfa Hx gestational diabetes with last pregnancy Hx pre-eclampsia with 1st pregnancy    Chief Complaint  Patient presents with  . Pelvic Pain     OB History    Grav Para Term Preterm Abortions TAB SAB Ect Mult Living   4 2 2  0 1 0 1 0 0 2      Past Medical History  Diagnosis Date  . Polycystic ovarian syndrome   . IBS (irritable bowel syndrome)     Past Surgical History  Procedure Date  . Cesarean section   . Dilation and curettage of uterus     Family History  Problem Relation Age of Onset  . Diabetes Mother   . Diabetes Maternal Grandmother   . Cancer Maternal Grandmother   . Diabetes Maternal Grandfather   . Diabetes Paternal Grandmother   . Diabetes Paternal Grandfather   . Anesthesia problems Neg Hx   . Hypotension Neg Hx   . Malignant hyperthermia Neg Hx   . Pseudochol deficiency Neg Hx     History  Substance Use Topics  . Smoking status: Not on file  . Smokeless tobacco: Not on file  . Alcohol Use: 0.0 oz/week    1-2 Glasses of wine per week     occ. drink    Allergies:  Allergies  Allergen Reactions  . Bactrim Other (See Comments)    Patient states that she gets blisters from this medication.  . Sulfa Antibiotics Other (See Comments)    Patient states that she gets blisters from this medication.    No prescriptions prior to admission     Physical Exam   Blood pressure 129/74, pulse 94, temperature 99.4 F (37.4 C), temperature source Oral, resp. rate 18, height  5\' 3"  (1.6 m), weight 85.548 kg (188 lb 9.6 oz), last menstrual period 11/10/2010, SpO2 99.00%.  Chest clear Heart RRR without murmur Abd--gravid, FH 19-20 weeks, +FHT 160.  Bedside US shows much FM. Upper abdomen remarkable for significant gaseous distension, +tympani, +bowel sounds. Large diastasis recti also noted, but no evidence hernia.  Mild tenderness over round ligament area. Pelvic--small amount white discharge in vault.  Cervix closed, long.  Results for orders placed during the hospital encounter of 03/23/11 (from the past 24 hour(s))  WET PREP, GENITAL     Status: Abnormal   Collection Time   03/23/11  6:10 PM      Component Value Range   Yeast, Wet Prep NONE SEEN  NONE SEEN    Trich, Wet Prep NONE SEEN  NONE SEEN    Clue Cells, Wet Prep NONE SEEN  NONE SEEN    WBC, Wet Prep HPF POC FEW (*) NONE SEEN   URINALYSIS, ROUTINE W REFLEX MICROSCOPIC     Status: Abnormal   Collection Time   03/23/11  6:20 PM      Component Value Range   Color, Urine YELLOW  YELLOW    APPearance HAZY (*) CLEAR    Specific Gravity, Urine 1.020  1.005 -  1.030    pH 6.5  5.0 - 8.0    Glucose, UA NEGATIVE  NEGATIVE (mg/dL)   Hgb urine dipstick NEGATIVE  NEGATIVE    Bilirubin Urine NEGATIVE  NEGATIVE    Ketones, ur NEGATIVE  NEGATIVE (mg/dL)   Protein, ur NEGATIVE  NEGATIVE (mg/dL)   Urobilinogen, UA 0.2  0.0 - 1.0 (mg/dL)   Nitrite NEGATIVE  NEGATIVE    Leukocytes, UA NEGATIVE  NEGATIVE    GC, Chlamydia done and pending.  ED Course  IUP at 18 6/7 weeks Constipation, with gas pain Round ligament pain  Plan: Motrin 600 mg po now and Rx'd to [facility]. Constipation issues reviewed and instructions given. Keep scheduled appointment at [facility] or call prn.  Nigel Bridgeman, CNM, [place] 03/23/11 6:55p

## 2011-03-23 NOTE — Progress Notes (Signed)
Patient states she has been having pelvic pressure for a while. States she is unable to sleep and feels weak. Has periods of shortness of breath. Has had leaking of clear fluid today.

## 2011-03-24 LAB — GC/CHLAMYDIA PROBE AMP, GENITAL: GC Probe Amp, Genital: NEGATIVE

## 2011-03-30 ENCOUNTER — Other Ambulatory Visit: Payer: Self-pay | Admitting: Obstetrics and Gynecology

## 2011-04-20 ENCOUNTER — Inpatient Hospital Stay (HOSPITAL_COMMUNITY)
Admission: AD | Admit: 2011-04-20 | Discharge: 2011-04-20 | Disposition: A | Discharge status: Home / Self Care | Payer: Medicaid Other | Source: Ambulatory Visit | Attending: Obstetrics and Gynecology | Admitting: Obstetrics and Gynecology

## 2011-04-20 ENCOUNTER — Encounter (HOSPITAL_COMMUNITY): Payer: Self-pay

## 2011-04-20 DIAGNOSIS — O2390 Unspecified genitourinary tract infection in pregnancy, unspecified trimester: Secondary | ICD-10-CM | POA: Insufficient documentation

## 2011-04-20 DIAGNOSIS — O09299 Supervision of pregnancy with other poor reproductive or obstetric history, unspecified trimester: Secondary | ICD-10-CM

## 2011-04-20 DIAGNOSIS — N390 Urinary tract infection, site not specified: Secondary | ICD-10-CM | POA: Insufficient documentation

## 2011-04-20 DIAGNOSIS — R109 Unspecified abdominal pain: Secondary | ICD-10-CM | POA: Insufficient documentation

## 2011-04-20 DIAGNOSIS — Z8632 Personal history of gestational diabetes: Secondary | ICD-10-CM

## 2011-04-20 DIAGNOSIS — R768 Other specified abnormal immunological findings in serum: Secondary | ICD-10-CM

## 2011-04-20 DIAGNOSIS — O210 Mild hyperemesis gravidarum: Secondary | ICD-10-CM | POA: Insufficient documentation

## 2011-04-20 DIAGNOSIS — Z98891 History of uterine scar from previous surgery: Secondary | ICD-10-CM

## 2011-04-20 HISTORY — DX: Gestational (pregnancy-induced) hypertension without significant proteinuria, unspecified trimester: O13.9

## 2011-04-20 HISTORY — DX: Gestational diabetes mellitus in pregnancy, unspecified control: O24.419

## 2011-04-20 LAB — CBC
Platelets: 205 10*3/uL (ref 150–400)
RBC: 4 MIL/uL (ref 3.87–5.11)
RDW: 13.4 % (ref 11.5–15.5)
WBC: 8.6 10*3/uL (ref 4.0–10.5)

## 2011-04-20 LAB — URINE MICROSCOPIC-ADD ON

## 2011-04-20 LAB — URINALYSIS, ROUTINE W REFLEX MICROSCOPIC
Glucose, UA: NEGATIVE mg/dL
Ketones, ur: 40 mg/dL — AB
Leukocytes, UA: NEGATIVE
Nitrite: NEGATIVE
Specific Gravity, Urine: 1.015 (ref 1.005–1.030)
pH: 6.5 (ref 5.0–8.0)

## 2011-04-20 LAB — DIFFERENTIAL
Basophils Absolute: 0 10*3/uL (ref 0.0–0.1)
Eosinophils Relative: 0 % (ref 0–5)
Lymphocytes Relative: 8 % — ABNORMAL LOW (ref 12–46)
Lymphs Abs: 0.7 10*3/uL (ref 0.7–4.0)
Neutrophils Relative %: 85 % — ABNORMAL HIGH (ref 43–77)

## 2011-04-20 LAB — WET PREP, GENITAL: Yeast Wet Prep HPF POC: NONE SEEN

## 2011-04-20 MED ORDER — METOCLOPRAMIDE HCL 5 MG/ML IJ SOLN
10.0000 mg | Freq: Once | INTRAMUSCULAR | Status: AC
  Administered 2011-04-20: 10 mg via INTRAVENOUS
  Filled 2011-04-20: qty 2

## 2011-04-20 MED ORDER — LACTATED RINGERS IV BOLUS (SEPSIS)
1000.0000 mL | Freq: Once | INTRAVENOUS | Status: AC
  Administered 2011-04-20: 1000 mL via INTRAVENOUS

## 2011-04-20 MED ORDER — ONDANSETRON 8 MG PO TBDP: 8.0000 mg | ORAL_TABLET | Freq: Three times a day (TID) | ORAL | Status: AC | PRN

## 2011-04-20 MED ORDER — LACTATED RINGERS IV SOLN
INTRAVENOUS | Status: DC
  Administered 2011-04-20: 14:00:00 via INTRAVENOUS

## 2011-04-20 MED ORDER — PANTOPRAZOLE SODIUM 40 MG IV SOLR
40.0000 mg | Freq: Once | INTRAVENOUS | Status: AC
  Administered 2011-04-20: 40 mg via INTRAVENOUS
  Filled 2011-04-20: qty 40

## 2011-04-20 MED ORDER — ONDANSETRON HCL 4 MG/2ML IJ SOLN
4.0000 mg | Freq: Once | INTRAMUSCULAR | Status: AC
  Administered 2011-04-20: 4 mg via INTRAVENOUS
  Filled 2011-04-20: qty 2

## 2011-04-20 MED ORDER — NITROFURANTOIN MONOHYD MACRO 100 MG PO CAPS: 100.0000 mg | ORAL_CAPSULE | Freq: Two times a day (BID) | ORAL | Status: AC

## 2011-04-20 NOTE — ED Provider Notes (Signed)
History     No chief complaint on file.  HPI Comments: Pt is a 31yo G4P2 at [redacted]w[redacted]d that presents with c/o leaking clear fluid since this morning. After further questioning, pt then states she's been vomiting since about 3am, and now has onset of generalized abdominal pain and back pain. Pt denies any VB, GFM. Unable to provide voided specimen. Denies any fever, states no other family members have been sick. Reports +BM this morning.  States she feels like she's leaking fluid every time she throws up. Denies sore throat/cough, no dysuria.   Pregnancy remarkable for: PCOS Previous C/S x2 Hx macrosomia IBS Allergies to Bactrim, Sulfa Hx gestational diabetes with last pregnancy Hx post-partum pre-eclampsia with 1st pregnancy +ANA Hx anti-lewis antibody Hx +CT in 1st trimester       Past Medical History  Diagnosis Date  . Polycystic ovarian syndrome   . IBS (irritable bowel syndrome)   . Gestational diabetes     diet controlled  . Pregnancy induced hypertension     with G1  . Complication of anesthesia   . PONV (postoperative nausea and vomiting)     Past Surgical History  Procedure Date  . Cesarean section   . Dilation and curettage of uterus     Family History  Problem Relation Age of Onset  . Diabetes Mother   . Diabetes Maternal Grandmother   . Cancer Maternal Grandmother   . Diabetes Maternal Grandfather   . Diabetes Paternal Grandmother   . Diabetes Paternal Grandfather   . Anesthesia problems Neg Hx   . Hypotension Neg Hx   . Malignant hyperthermia Neg Hx   . Pseudochol deficiency Neg Hx     History  Substance Use Topics  . Smoking status: Not on file  . Smokeless tobacco: Not on file  . Alcohol Use: 0.0 oz/week    1-2 Glasses of wine per week     occ. drink    Allergies:  Allergies  Allergen Reactions  . Bactrim Other (See Comments)    Patient states that she gets blisters from this medication.  . Sulfa Antibiotics Other (See Comments)   Patient states that she gets blisters from this medication.    Prescriptions prior to admission  Medication Sig Dispense Refill  . Prenatal Vit-Fe Fumarate-FA (PRENATAL MULTIVITAMIN) TABS Take 1 tablet by mouth daily.          Review of Systems  Constitutional: Positive for malaise/fatigue.  Gastrointestinal: Positive for nausea, vomiting, abdominal pain and diarrhea.  Genitourinary: Negative for dysuria and urgency.  Musculoskeletal: Positive for back pain.  Neurological: Positive for weakness.  All other systems reviewed and are negative.   Physical Exam   Blood pressure 106/72, pulse 99, temperature 98.2 F (36.8 C), temperature source Oral, resp. rate 20, height 5\' 3"  (1.6 m), weight 87.091 kg (192 lb), last menstrual period 11/10/2010.  Physical Exam  Nursing note and vitals reviewed. Constitutional: She is oriented to person, place, and time. She appears well-developed and well-nourished. She appears distressed.  HENT:  Head: Normocephalic.  Neck: Normal range of motion.  Cardiovascular: Normal rate.   Respiratory: Effort normal.  GI: Soft. Bowel sounds are normal. She exhibits no distension and no mass. There is tenderness. There is guarding. There is no rebound.       Generalized acute tenderness with light palpation  Genitourinary: Vaginal discharge found.       Scant amt clear d/c in vault, fern slide neg Wet prep and gc/ct sent  Musculoskeletal:  Normal range of motion. She exhibits no edema and no tenderness.  Neurological: She is alert and oriented to person, place, and time.  Skin: Skin is warm and dry.  Psychiatric: She has a normal mood and affect. Her behavior is normal.   Pelvic - cx is closed thick and high, pt did not tolerate exam well +FHT's =159  MAU Course  Procedures    Assessment and Plan  IUP at [redacted]w[redacted]d Acute N/V with abd pain R/o ROM and vaginitis R/o UTI  IVF's with zofran Wet prep Gc/ct  ua  Fern slide - neg    Jaylen Claude  M 04/20/2011, 12:16 PM   Addendum at 1305  Results for orders placed during the hospital encounter of 04/20/11 (from the past 24 hour(s))  WET PREP, GENITAL     Status: Abnormal   Collection Time   04/20/11 12:07 PM      Component Value Range   Yeast Wet Prep HPF POC NONE SEEN  NONE SEEN    Trich, Wet Prep NONE SEEN  NONE SEEN    Clue Cells Wet Prep HPF POC NONE SEEN  NONE SEEN    WBC, Wet Prep HPF POC FEW (*) NONE SEEN   CBC     Status: Abnormal   Collection Time   04/20/11 12:22 PM      Component Value Range   WBC 8.6  4.0 - 10.5 (K/uL)   RBC 4.00  3.87 - 5.11 (MIL/uL)   Hemoglobin 12.0  12.0 - 15.0 (g/dL)   HCT 45.4 (*) 09.8 - 46.0 (%)   MCV 89.3  78.0 - 100.0 (fL)   MCH 30.0  26.0 - 34.0 (pg)   MCHC 33.6  30.0 - 36.0 (g/dL)   RDW 11.9  14.7 - 82.9 (%)   Platelets 205  150 - 400 (K/uL)  DIFFERENTIAL     Status: Abnormal   Collection Time   04/20/11 12:22 PM      Component Value Range   Neutrophils Relative 85 (*) 43 - 77 (%)   Neutro Abs 7.3  1.7 - 7.7 (K/uL)   Lymphocytes Relative 8 (*) 12 - 46 (%)   Lymphs Abs 0.7  0.7 - 4.0 (K/uL)   Monocytes Relative 7  3 - 12 (%)   Monocytes Absolute 0.6  0.1 - 1.0 (K/uL)   Eosinophils Relative 0  0 - 5 (%)   Eosinophils Absolute 0.0  0.0 - 0.7 (K/uL)   Basophils Relative 0  0 - 1 (%)   Basophils Absolute 0.0  0.0 - 0.1 (K/uL)     Pt feels better after fluids and zofran Still unable to void, will check UA and PO challenge and plan to DC home  Addendum: at 1538  Feels better, no more vomiting, tol ginger ale and crackers,  Probable UTI - +ketones, +protein, +bacteria Will send for cx  D/C home with zofran RX macrobid RX BRAT diet F/u office nxt week

## 2011-04-20 NOTE — Progress Notes (Signed)
Pt states nausea & vomiting started this morning at 0300. Noticed leaking clear fluid after episodes of vomiting. Denies vaginal bleeding. States didn't feel fetal movement until being brought into the room in MAU. Complains of abdominal cramping and back pain since the vomiting. States has vomited 5 times today.

## 2011-04-21 LAB — GC/CHLAMYDIA PROBE AMP, GENITAL
Chlamydia, DNA Probe: NEGATIVE
GC Probe Amp, Genital: NEGATIVE

## 2011-05-19 IMAGING — CR DG CHEST 2V
2 series · 2 of 2 positions shown · non-contrast
Comparison: None

CLINICAL DATA: Cough and fever.  Shortness of breath.

CHEST - 2 VIEW

[w chest pa]
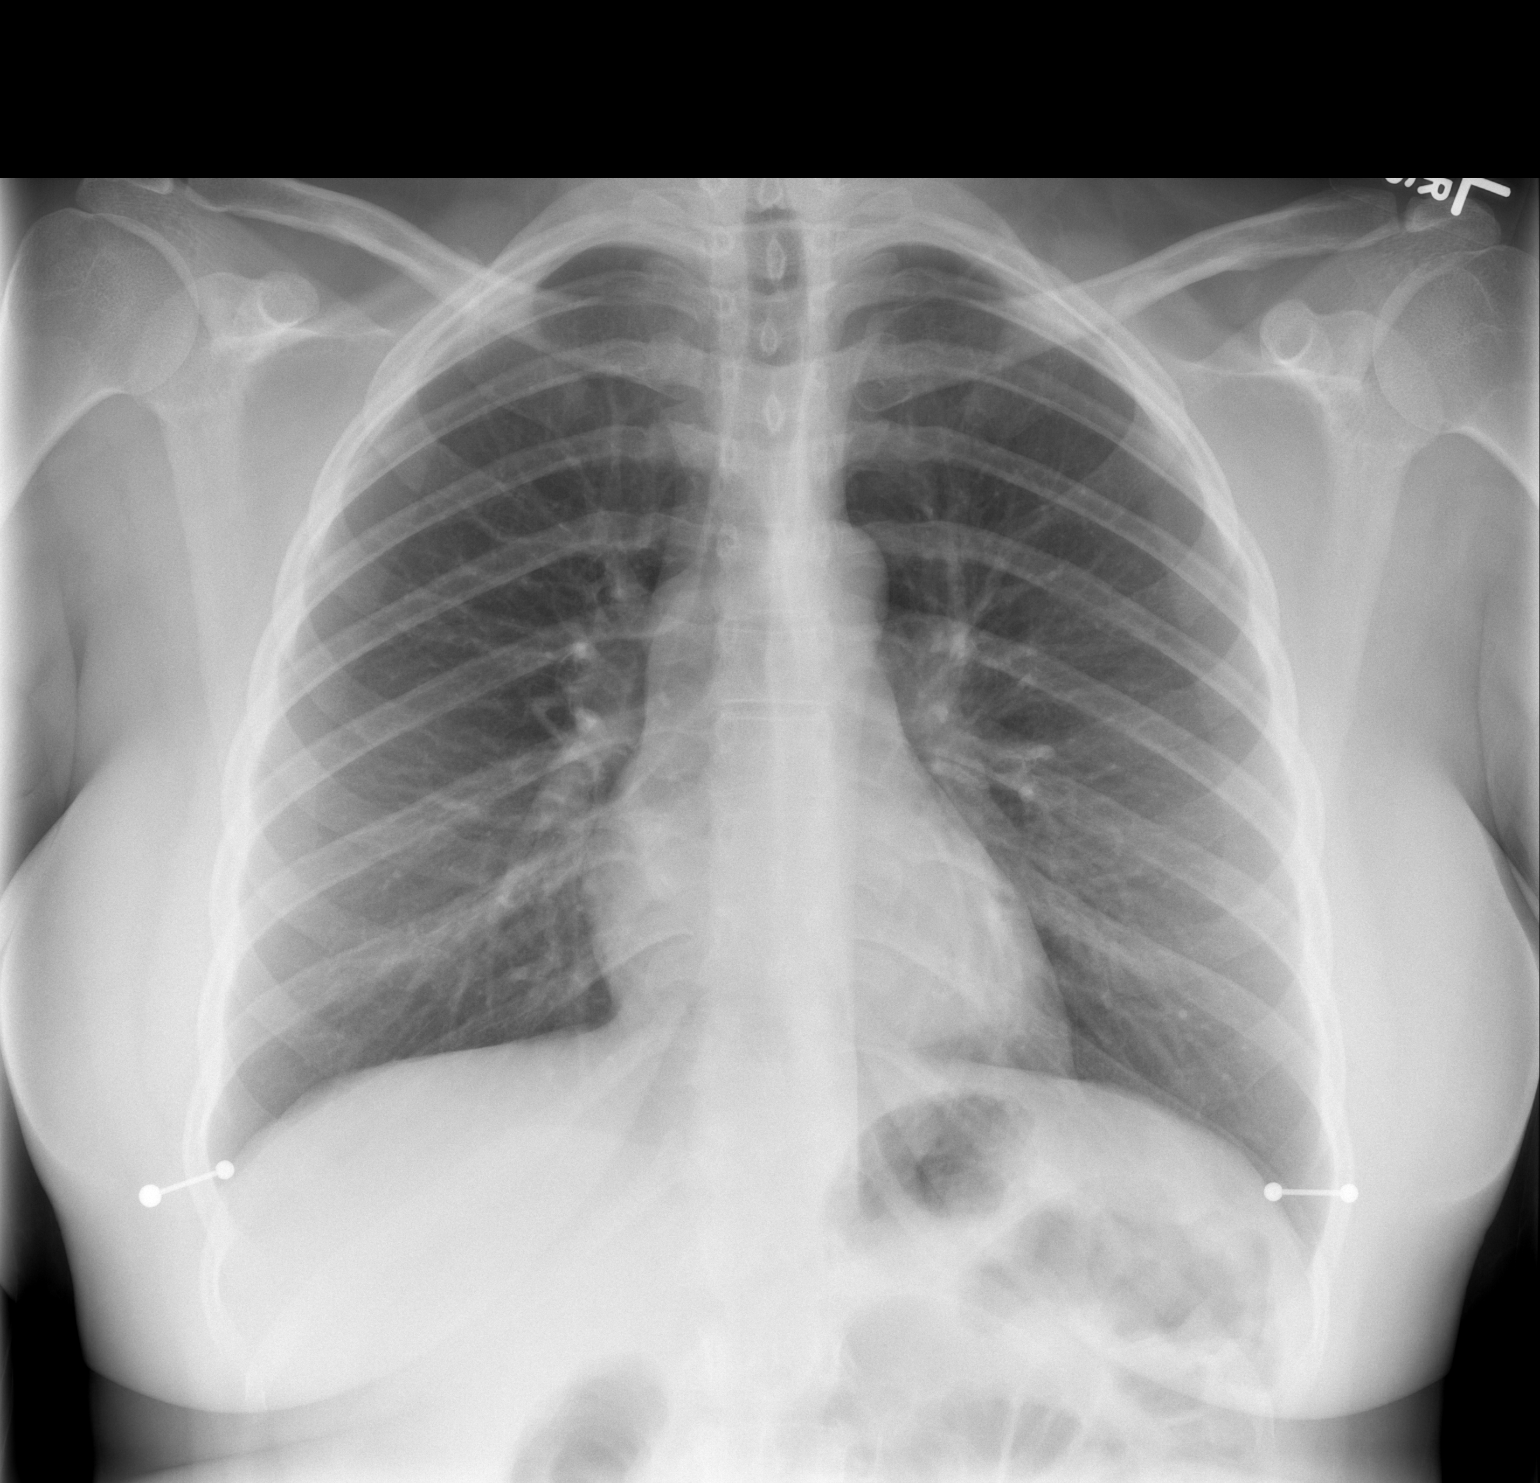

[w chest lat]
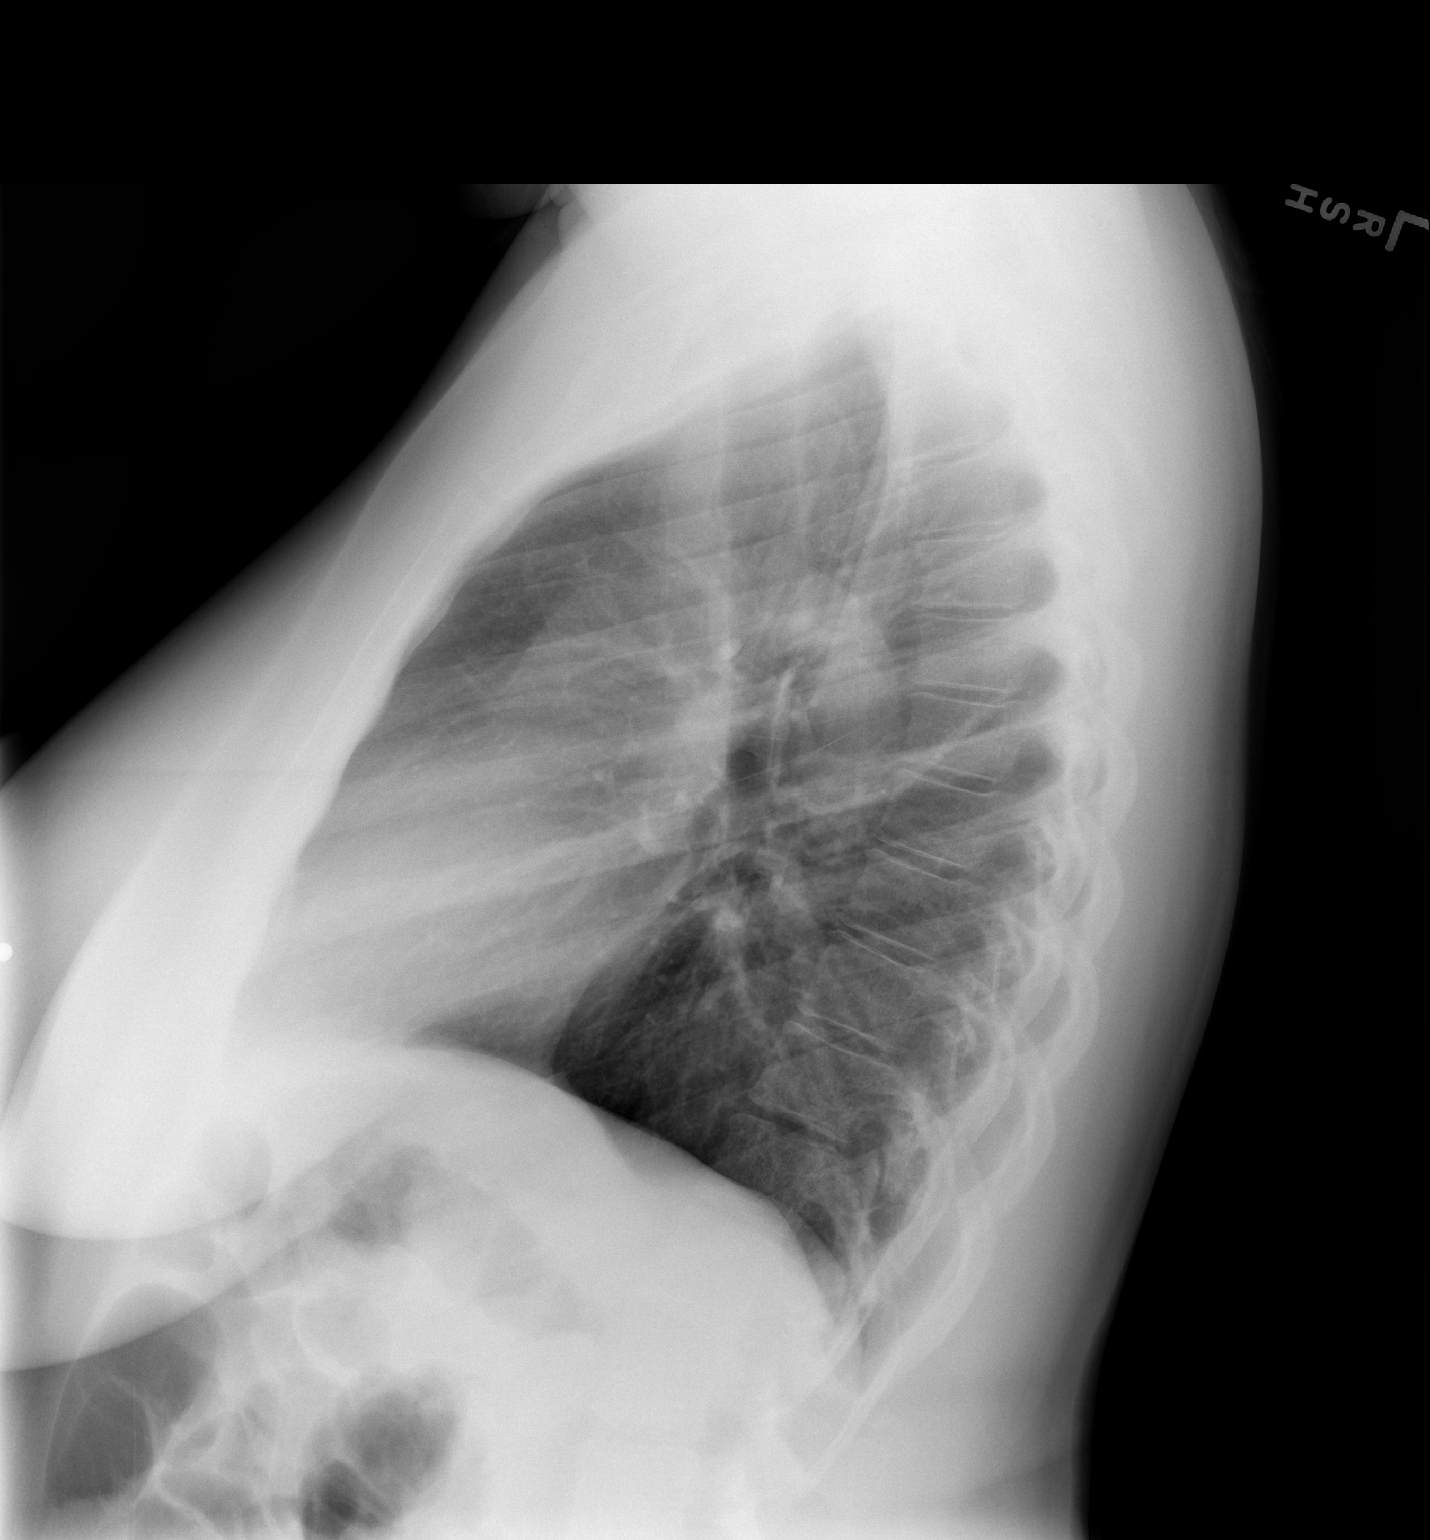

[2 of 2 positions shown; findings below may reference images not displayed]

FINDINGS: The heart size and mediastinal contours are within
normal limits.  Both lungs are clear.  The visualized skeletal
structures are unremarkable.
IMPRESSION: No active cardiopulmonary disease.

## 2011-05-25 ENCOUNTER — Encounter (INDEPENDENT_AMBULATORY_CARE_PROVIDER_SITE_OTHER): Payer: Medicaid Other | Admitting: Obstetrics and Gynecology

## 2011-05-25 ENCOUNTER — Encounter: Payer: Self-pay | Admitting: Obstetrics and Gynecology

## 2011-05-25 ENCOUNTER — Other Ambulatory Visit: Payer: Medicaid Other

## 2011-05-25 ENCOUNTER — Other Ambulatory Visit: Payer: Self-pay

## 2011-05-25 DIAGNOSIS — Z331 Pregnant state, incidental: Secondary | ICD-10-CM

## 2011-05-25 DIAGNOSIS — O34219 Maternal care for unspecified type scar from previous cesarean delivery: Secondary | ICD-10-CM

## 2011-06-09 ENCOUNTER — Encounter: Payer: Medicaid Other | Admitting: Obstetrics and Gynecology

## 2011-06-09 ENCOUNTER — Other Ambulatory Visit: Payer: Medicaid Other

## 2011-06-14 ENCOUNTER — Other Ambulatory Visit: Payer: Medicaid Other

## 2011-06-14 ENCOUNTER — Encounter (INDEPENDENT_AMBULATORY_CARE_PROVIDER_SITE_OTHER): Payer: Medicaid Other | Admitting: Registered Nurse

## 2011-06-14 DIAGNOSIS — Z3480 Encounter for supervision of other normal pregnancy, unspecified trimester: Secondary | ICD-10-CM

## 2011-06-20 ENCOUNTER — Other Ambulatory Visit: Payer: Self-pay

## 2011-06-20 DIAGNOSIS — O99810 Abnormal glucose complicating pregnancy: Secondary | ICD-10-CM

## 2011-06-27 ENCOUNTER — Encounter: Payer: Medicaid Other | Admitting: Obstetrics and Gynecology

## 2011-06-27 ENCOUNTER — Encounter (INDEPENDENT_AMBULATORY_CARE_PROVIDER_SITE_OTHER): Payer: Medicaid Other | Admitting: Obstetrics and Gynecology

## 2011-06-27 DIAGNOSIS — Z331 Pregnant state, incidental: Secondary | ICD-10-CM

## 2011-06-28 ENCOUNTER — Ambulatory Visit (INDEPENDENT_AMBULATORY_CARE_PROVIDER_SITE_OTHER): Payer: Medicaid Other

## 2011-06-28 ENCOUNTER — Ambulatory Visit (INDEPENDENT_AMBULATORY_CARE_PROVIDER_SITE_OTHER): Payer: Medicaid Other | Admitting: Obstetrics and Gynecology

## 2011-06-28 ENCOUNTER — Other Ambulatory Visit: Payer: Medicaid Other

## 2011-06-28 VITALS — BP 98/50 | Wt 205.0 lb

## 2011-06-28 DIAGNOSIS — Z98891 History of uterine scar from previous surgery: Secondary | ICD-10-CM

## 2011-06-28 DIAGNOSIS — Z9889 Other specified postprocedural states: Secondary | ICD-10-CM

## 2011-06-28 DIAGNOSIS — Z3480 Encounter for supervision of other normal pregnancy, unspecified trimester: Secondary | ICD-10-CM

## 2011-06-28 DIAGNOSIS — O99810 Abnormal glucose complicating pregnancy: Secondary | ICD-10-CM

## 2011-06-28 LAB — US OB FOLLOW UP

## 2011-06-28 NOTE — Progress Notes (Signed)
Pt c/o bilateral foot and calf pain x 2wks;primarily with walking and occ with sitting.

## 2011-06-29 NOTE — Progress Notes (Signed)
Declines BTL.  Wants Mirena pp

## 2011-06-30 ENCOUNTER — Other Ambulatory Visit: Payer: Self-pay | Admitting: Obstetrics and Gynecology

## 2011-07-13 DIAGNOSIS — I10 Essential (primary) hypertension: Secondary | ICD-10-CM

## 2011-07-13 DIAGNOSIS — R809 Proteinuria, unspecified: Secondary | ICD-10-CM | POA: Insufficient documentation

## 2011-07-14 ENCOUNTER — Ambulatory Visit (INDEPENDENT_AMBULATORY_CARE_PROVIDER_SITE_OTHER): Payer: Medicaid Other | Admitting: Obstetrics and Gynecology

## 2011-07-14 ENCOUNTER — Encounter: Payer: Self-pay | Admitting: Obstetrics and Gynecology

## 2011-07-14 VITALS — BP 120/70 | Wt 212.0 lb

## 2011-07-14 DIAGNOSIS — Z9889 Other specified postprocedural states: Secondary | ICD-10-CM

## 2011-07-14 DIAGNOSIS — Z331 Pregnant state, incidental: Secondary | ICD-10-CM

## 2011-07-14 DIAGNOSIS — Z98891 History of uterine scar from previous surgery: Secondary | ICD-10-CM

## 2011-07-14 DIAGNOSIS — I10 Essential (primary) hypertension: Secondary | ICD-10-CM

## 2011-07-14 NOTE — Progress Notes (Signed)
Feeling there is no more room to grow. No reg contractions. No change in D/C. C/O occ headache and increase swelling in feet. No visual symptoms NST: reactive

## 2011-07-21 ENCOUNTER — Ambulatory Visit: Payer: Medicaid Other | Admitting: Obstetrics and Gynecology

## 2011-07-21 ENCOUNTER — Other Ambulatory Visit: Payer: Medicaid Other

## 2011-07-21 ENCOUNTER — Ambulatory Visit (INDEPENDENT_AMBULATORY_CARE_PROVIDER_SITE_OTHER): Payer: Medicaid Other | Admitting: Obstetrics and Gynecology

## 2011-07-21 VITALS — BP 110/70 | Wt 211.0 lb

## 2011-07-21 DIAGNOSIS — Z202 Contact with and (suspected) exposure to infections with a predominantly sexual mode of transmission: Secondary | ICD-10-CM

## 2011-07-21 DIAGNOSIS — Z3490 Encounter for supervision of normal pregnancy, unspecified, unspecified trimester: Secondary | ICD-10-CM

## 2011-07-21 DIAGNOSIS — O210 Mild hyperemesis gravidarum: Secondary | ICD-10-CM

## 2011-07-21 DIAGNOSIS — Z3685 Encounter for antenatal screening for Streptococcus B: Secondary | ICD-10-CM

## 2011-07-21 DIAGNOSIS — Z113 Encounter for screening for infections with a predominantly sexual mode of transmission: Secondary | ICD-10-CM

## 2011-07-21 DIAGNOSIS — O10919 Unspecified pre-existing hypertension complicating pregnancy, unspecified trimester: Secondary | ICD-10-CM

## 2011-07-21 DIAGNOSIS — O10019 Pre-existing essential hypertension complicating pregnancy, unspecified trimester: Secondary | ICD-10-CM

## 2011-07-21 DIAGNOSIS — Z3480 Encounter for supervision of other normal pregnancy, unspecified trimester: Secondary | ICD-10-CM

## 2011-07-21 DIAGNOSIS — Z331 Pregnant state, incidental: Secondary | ICD-10-CM

## 2011-07-21 NOTE — Progress Notes (Signed)
No complaints NST Reactive GBS/GC/CT today with consent BPP and EFW in 1wk secondary to h/o CHTN on no meds C/S scheduled for 08/13/11 with me FKCs RTO 1wk

## 2011-07-22 ENCOUNTER — Other Ambulatory Visit: Payer: Self-pay | Admitting: Obstetrics and Gynecology

## 2011-07-22 DIAGNOSIS — I10 Essential (primary) hypertension: Secondary | ICD-10-CM

## 2011-07-25 ENCOUNTER — Ambulatory Visit (INDEPENDENT_AMBULATORY_CARE_PROVIDER_SITE_OTHER): Payer: Medicaid Other | Admitting: Obstetrics and Gynecology

## 2011-07-25 ENCOUNTER — Ambulatory Visit (INDEPENDENT_AMBULATORY_CARE_PROVIDER_SITE_OTHER): Payer: Medicaid Other

## 2011-07-25 ENCOUNTER — Other Ambulatory Visit: Payer: Self-pay | Admitting: Obstetrics and Gynecology

## 2011-07-25 VITALS — BP 110/68 | Wt 212.0 lb

## 2011-07-25 DIAGNOSIS — Z331 Pregnant state, incidental: Secondary | ICD-10-CM

## 2011-07-25 DIAGNOSIS — I10 Essential (primary) hypertension: Secondary | ICD-10-CM

## 2011-07-25 LAB — POCT URINALYSIS DIPSTICK
Ketones, UA: NEGATIVE
Spec Grav, UA: 1.015
Urobilinogen, UA: NEGATIVE
pH, UA: 7

## 2011-07-25 LAB — US OB FOLLOW UP

## 2011-07-25 NOTE — Progress Notes (Signed)
Pt.stated no issues today normal pressure.

## 2011-07-25 NOTE — Progress Notes (Addendum)
Doing well.  GC, Chlamydia, and beta strep are all negative. Ultrasound: Single gestation, vertex, normal fluid, 37 weeks and 2 days (74th percentile), grade 1 placenta. Return office in 1 week.  Cesarean section is scheduled for 39 weeks. Urinalysis: Negative  Dr. Stefano Gaul

## 2011-07-25 NOTE — Progress Notes (Signed)
ultrasound

## 2011-07-26 ENCOUNTER — Telehealth: Payer: Self-pay

## 2011-07-26 ENCOUNTER — Other Ambulatory Visit: Payer: Self-pay

## 2011-07-26 DIAGNOSIS — I10 Essential (primary) hypertension: Secondary | ICD-10-CM

## 2011-07-26 NOTE — Telephone Encounter (Signed)
BPP has been scheduled and order has been put in  for 08-01-11 at 12pm w/ U/S 2. Still awaiting pt call back to schedule NST.[facility] CMA

## 2011-07-26 NOTE — Telephone Encounter (Signed)
Called pt again to make her aware that U/S was scheduled for 08-01-11 at 12pm and NST needs to be scheduled this week per Dr. Stefano Gaul awaiting pt call. [facility] CMA

## 2011-07-26 NOTE — Telephone Encounter (Signed)
Per Dr.Stringer pt needs NST scheduled 07-28-2011 and BPP  w/ her scheduled 08-01-11 appt. Pt was called at 9:15am 07-26-11 and a voice mail was left  for her to call office back. [facility] CMA

## 2011-07-26 NOTE — Telephone Encounter (Signed)
Pt called back to confirm Bpp U/S on 08-01-11 at 12pm and NST was scheduled for 07-28-11 @ 1:30pm

## 2011-07-28 ENCOUNTER — Other Ambulatory Visit: Payer: Medicaid Other

## 2011-07-29 ENCOUNTER — Other Ambulatory Visit: Payer: Self-pay

## 2011-08-01 ENCOUNTER — Ambulatory Visit: Payer: Medicaid Other

## 2011-08-01 ENCOUNTER — Encounter (HOSPITAL_COMMUNITY): Payer: Self-pay | Admitting: Pharmacist

## 2011-08-01 ENCOUNTER — Ambulatory Visit (INDEPENDENT_AMBULATORY_CARE_PROVIDER_SITE_OTHER): Payer: Medicaid Other | Admitting: Obstetrics and Gynecology

## 2011-08-01 ENCOUNTER — Encounter: Payer: Self-pay | Admitting: Obstetrics and Gynecology

## 2011-08-01 VITALS — BP 110/60 | Wt 211.0 lb

## 2011-08-01 DIAGNOSIS — I10 Essential (primary) hypertension: Secondary | ICD-10-CM

## 2011-08-01 DIAGNOSIS — O10919 Unspecified pre-existing hypertension complicating pregnancy, unspecified trimester: Secondary | ICD-10-CM

## 2011-08-01 DIAGNOSIS — Z331 Pregnant state, incidental: Secondary | ICD-10-CM

## 2011-08-01 DIAGNOSIS — O10019 Pre-existing essential hypertension complicating pregnancy, unspecified trimester: Secondary | ICD-10-CM

## 2011-08-01 NOTE — Progress Notes (Signed)
The patient declined NST and BPP.  Return to office in 1 week.  BPP next week encouraged.  Dr. Stefano Gaul

## 2011-08-01 NOTE — Progress Notes (Signed)
Pt states she has no concerns  pt missed BPP appt today and pt declined NST. Pt had a Dr.Pepper before her appt.

## 2011-08-05 ENCOUNTER — Encounter (HOSPITAL_COMMUNITY): Payer: Self-pay

## 2011-08-08 ENCOUNTER — Encounter (HOSPITAL_COMMUNITY): Payer: Self-pay

## 2011-08-08 ENCOUNTER — Encounter (HOSPITAL_COMMUNITY)
Admission: RE | Admit: 2011-08-08 | Discharge: 2011-08-08 | Disposition: A | Discharge status: Home / Self Care | Payer: Medicaid Other | Source: Ambulatory Visit | Attending: Obstetrics and Gynecology | Admitting: Obstetrics and Gynecology

## 2011-08-08 ENCOUNTER — Telehealth: Payer: Self-pay | Admitting: Obstetrics and Gynecology

## 2011-08-08 VITALS — BP 122/80 | Ht 63.0 in | Wt 213.0 lb

## 2011-08-08 DIAGNOSIS — A568 Sexually transmitted chlamydial infection of other sites: Secondary | ICD-10-CM

## 2011-08-08 DIAGNOSIS — O09299 Supervision of pregnancy with other poor reproductive or obstetric history, unspecified trimester: Secondary | ICD-10-CM

## 2011-08-08 DIAGNOSIS — Z98891 History of uterine scar from previous surgery: Secondary | ICD-10-CM

## 2011-08-08 DIAGNOSIS — R809 Proteinuria, unspecified: Secondary | ICD-10-CM

## 2011-08-08 DIAGNOSIS — Z8632 Personal history of gestational diabetes: Secondary | ICD-10-CM

## 2011-08-08 HISTORY — DX: Sleep apnea, unspecified: G47.30

## 2011-08-08 LAB — CBC
HCT: 36.7 % (ref 36.0–46.0)
MCHC: 32.4 g/dL (ref 30.0–36.0)
Platelets: 203 10*3/uL (ref 150–400)
RDW: 14.4 % (ref 11.5–15.5)
WBC: 5.8 10*3/uL (ref 4.0–10.5)

## 2011-08-08 LAB — RPR: RPR Ser Ql: NONREACTIVE

## 2011-08-08 LAB — SURGICAL PCR SCREEN
MRSA, PCR: NEGATIVE
Staphylococcus aureus: NEGATIVE

## 2011-08-08 NOTE — Pre-Procedure Instructions (Signed)
Blood bank ( [facility]) notified of pt's positive Rh antibody.

## 2011-08-08 NOTE — Telephone Encounter (Signed)
Repeat C/S scheduled for 08/13/11 @ 9:30 with AR/CNM Adrianne Pridgen

## 2011-08-08 NOTE — Patient Instructions (Addendum)
YOUR PROCEDURE IS SCHEDULED ON:08/13/11  ENTER THROUGH THE MAIN ENTRANCE OF [facility] AT:0800 am  USE DESK PHONE AND DIAL [id number] TO INFORM us OF YOUR ARRIVAL  CALL [phone] IF YOU HAVE ANY QUESTIONS OR PROBLEMS PRIOR TO YOUR ARRIVAL.  REMEMBER: DO NOT EAT OR DRINK AFTER MIDNIGHT : Friday  SPECIAL INSTRUCTIONS:water ok until 5:30 am Friday   YOU MAY BRUSH YOUR TEETH THE MORNING OF SURGERY   TAKE THESE MEDICINES THE DAY OF SURGERY WITH SIP OF WATER:none   DO NOT WEAR JEWELRY, EYE MAKEUP, LIPSTICK OR DARK FINGERNAIL POLISH DO NOT WEAR LOTIONS  DO NOT SHAVE FOR 48 HOURS PRIOR TO SURGERY  YOU WILL NOT BE ALLOWED TO DRIVE YOURSELF HOME.  NAME OF DRIVER: sisterErlinda Hong- [phone]

## 2011-08-11 ENCOUNTER — Encounter: Payer: Self-pay | Admitting: Obstetrics and Gynecology

## 2011-08-11 ENCOUNTER — Ambulatory Visit (INDEPENDENT_AMBULATORY_CARE_PROVIDER_SITE_OTHER): Payer: Medicaid Other

## 2011-08-11 ENCOUNTER — Encounter (HOSPITAL_COMMUNITY): Admission: RE | Payer: Self-pay | Source: Ambulatory Visit

## 2011-08-11 ENCOUNTER — Inpatient Hospital Stay (HOSPITAL_COMMUNITY)
Admission: RE | Admit: 2011-08-11 | Payer: Medicaid Other | Source: Ambulatory Visit | Admitting: Obstetrics and Gynecology

## 2011-08-11 ENCOUNTER — Ambulatory Visit (INDEPENDENT_AMBULATORY_CARE_PROVIDER_SITE_OTHER): Payer: Medicaid Other | Admitting: Obstetrics and Gynecology

## 2011-08-11 VITALS — BP 108/60 | Wt 214.0 lb

## 2011-08-11 DIAGNOSIS — O24419 Gestational diabetes mellitus in pregnancy, unspecified control: Secondary | ICD-10-CM

## 2011-08-11 DIAGNOSIS — Z98891 History of uterine scar from previous surgery: Secondary | ICD-10-CM

## 2011-08-11 DIAGNOSIS — Z8632 Personal history of gestational diabetes: Secondary | ICD-10-CM

## 2011-08-11 DIAGNOSIS — O10019 Pre-existing essential hypertension complicating pregnancy, unspecified trimester: Secondary | ICD-10-CM

## 2011-08-11 DIAGNOSIS — O99810 Abnormal glucose complicating pregnancy: Secondary | ICD-10-CM

## 2011-08-11 DIAGNOSIS — Z9889 Other specified postprocedural states: Secondary | ICD-10-CM

## 2011-08-11 LAB — US OB FOLLOW UP

## 2011-08-11 SURGERY — Surgical Case
Anesthesia: Spinal

## 2011-08-11 NOTE — Progress Notes (Signed)
Wants cx checked and membranes sweeped Blood glucose- 109 Pt had a strawberry-limeade @ 2:45pm prior to coming to appt.

## 2011-08-11 NOTE — Patient Instructions (Signed)

## 2011-08-12 MED ORDER — CEFAZOLIN SODIUM-DEXTROSE 2-3 GM-% IV SOLR
2.0000 g | INTRAVENOUS | Status: AC
  Administered 2011-08-13: 2 g via INTRAVENOUS
  Filled 2011-08-12: qty 50

## 2011-08-13 ENCOUNTER — Encounter (HOSPITAL_COMMUNITY): Payer: Self-pay | Admitting: Obstetrics and Gynecology

## 2011-08-13 ENCOUNTER — Encounter (HOSPITAL_COMMUNITY): Payer: Self-pay

## 2011-08-13 ENCOUNTER — Inpatient Hospital Stay (HOSPITAL_COMMUNITY)
Admission: RE | Admit: 2011-08-13 | Discharge: 2011-08-16 | DRG: 766 | Disposition: A | Discharge status: Home / Self Care | Payer: Medicaid Other | Source: Ambulatory Visit | Attending: Obstetrics and Gynecology | Admitting: Obstetrics and Gynecology

## 2011-08-13 ENCOUNTER — Encounter (HOSPITAL_COMMUNITY): Payer: Self-pay | Admitting: *Deleted

## 2011-08-13 ENCOUNTER — Inpatient Hospital Stay (HOSPITAL_COMMUNITY): Payer: Medicaid Other

## 2011-08-13 ENCOUNTER — Encounter (HOSPITAL_COMMUNITY)
Admission: RE | Disposition: A | Discharge status: Home / Self Care | Payer: Self-pay | Source: Ambulatory Visit | Attending: Obstetrics and Gynecology

## 2011-08-13 DIAGNOSIS — A568 Sexually transmitted chlamydial infection of other sites: Secondary | ICD-10-CM

## 2011-08-13 DIAGNOSIS — D649 Anemia, unspecified: Secondary | ICD-10-CM | POA: Diagnosis not present

## 2011-08-13 DIAGNOSIS — R809 Proteinuria, unspecified: Secondary | ICD-10-CM

## 2011-08-13 DIAGNOSIS — O9903 Anemia complicating the puerperium: Secondary | ICD-10-CM | POA: Diagnosis not present

## 2011-08-13 DIAGNOSIS — Z98891 History of uterine scar from previous surgery: Secondary | ICD-10-CM

## 2011-08-13 DIAGNOSIS — O09299 Supervision of pregnancy with other poor reproductive or obstetric history, unspecified trimester: Secondary | ICD-10-CM

## 2011-08-13 DIAGNOSIS — N736 Female pelvic peritoneal adhesions (postinfective): Secondary | ICD-10-CM

## 2011-08-13 DIAGNOSIS — O34219 Maternal care for unspecified type scar from previous cesarean delivery: Principal | ICD-10-CM | POA: Diagnosis present

## 2011-08-13 DIAGNOSIS — Z8632 Personal history of gestational diabetes: Secondary | ICD-10-CM

## 2011-08-13 SURGERY — Surgical Case
Anesthesia: Spinal | Wound class: Clean Contaminated

## 2011-08-13 MED ORDER — ONDANSETRON HCL 4 MG/2ML IJ SOLN
INTRAMUSCULAR | Status: DC | PRN
  Administered 2011-08-13: 4 mg via INTRAVENOUS

## 2011-08-13 MED ORDER — IBUPROFEN 600 MG PO TABS
600.0000 mg | ORAL_TABLET | Freq: Four times a day (QID) | ORAL | Status: DC | PRN
  Administered 2011-08-14 – 2011-08-16 (×9): 600 mg via ORAL
  Filled 2011-08-13 (×11): qty 1

## 2011-08-13 MED ORDER — ONDANSETRON HCL 4 MG/2ML IJ SOLN: 4.0000 mg | Freq: Three times a day (TID) | INTRAMUSCULAR | Status: DC | PRN

## 2011-08-13 MED ORDER — KETOROLAC TROMETHAMINE 30 MG/ML IJ SOLN
30.0000 mg | Freq: Four times a day (QID) | INTRAMUSCULAR | Status: AC | PRN
  Administered 2011-08-13 – 2011-08-14 (×2): 30 mg via INTRAVENOUS
  Filled 2011-08-13: qty 1

## 2011-08-13 MED ORDER — DIPHENHYDRAMINE HCL 50 MG/ML IJ SOLN: 25.0000 mg | INTRAMUSCULAR | Status: DC | PRN

## 2011-08-13 MED ORDER — KETOROLAC TROMETHAMINE 60 MG/2ML IM SOLN
INTRAMUSCULAR | Status: AC
  Administered 2011-08-13: 60 mg via INTRAMUSCULAR
  Filled 2011-08-13: qty 2

## 2011-08-13 MED ORDER — KETOROLAC TROMETHAMINE 60 MG/2ML IM SOLN
60.0000 mg | Freq: Once | INTRAMUSCULAR | Status: AC | PRN
  Administered 2011-08-13: 60 mg via INTRAMUSCULAR

## 2011-08-13 MED ORDER — MORPHINE SULFATE 0.5 MG/ML IJ SOLN
INTRAMUSCULAR | Status: AC
  Filled 2011-08-13: qty 10

## 2011-08-13 MED ORDER — NALOXONE HCL 0.4 MG/ML IJ SOLN: 0.4000 mg | INTRAMUSCULAR | Status: DC | PRN

## 2011-08-13 MED ORDER — SODIUM CHLORIDE 0.9 % IJ SOLN: 3.0000 mL | INTRAMUSCULAR | Status: DC | PRN

## 2011-08-13 MED ORDER — EPHEDRINE SULFATE 50 MG/ML IJ SOLN
INTRAMUSCULAR | Status: DC | PRN
  Administered 2011-08-13 (×2): 30 mg via INTRAVENOUS
  Administered 2011-08-13: 20 mg via INTRAVENOUS

## 2011-08-13 MED ORDER — KETOROLAC TROMETHAMINE 30 MG/ML IJ SOLN
30.0000 mg | Freq: Four times a day (QID) | INTRAMUSCULAR | Status: AC | PRN
  Filled 2011-08-13: qty 1

## 2011-08-13 MED ORDER — PHENYLEPHRINE 40 MCG/ML (10ML) SYRINGE FOR IV PUSH (FOR BLOOD PRESSURE SUPPORT)
PREFILLED_SYRINGE | INTRAVENOUS | Status: AC
  Filled 2011-08-13: qty 5

## 2011-08-13 MED ORDER — METOCLOPRAMIDE HCL 5 MG/ML IJ SOLN: 10.0000 mg | Freq: Three times a day (TID) | INTRAMUSCULAR | Status: DC | PRN

## 2011-08-13 MED ORDER — NALBUPHINE HCL 10 MG/ML IJ SOLN
5.0000 mg | INTRAMUSCULAR | Status: DC | PRN
  Administered 2011-08-14: 10 mg via INTRAVENOUS
  Filled 2011-08-13 (×2): qty 1

## 2011-08-13 MED ORDER — SCOPOLAMINE 1 MG/3DAYS TD PT72
1.0000 | MEDICATED_PATCH | TRANSDERMAL | Status: DC
  Administered 2011-08-13: 1.5 mg via TRANSDERMAL

## 2011-08-13 MED ORDER — MORPHINE SULFATE (PF) 0.5 MG/ML IJ SOLN
INTRAMUSCULAR | Status: DC | PRN
  Administered 2011-08-13: .9 mg via INTRAVENOUS
  Administered 2011-08-13: .1 mg via INTRATHECAL

## 2011-08-13 MED ORDER — SODIUM CHLORIDE 0.9 % IV SOLN
1.0000 ug/kg/h | INTRAVENOUS | Status: DC | PRN
  Filled 2011-08-13: qty 2.5

## 2011-08-13 MED ORDER — MEPERIDINE HCL 25 MG/ML IJ SOLN: 6.2500 mg | INTRAMUSCULAR | Status: DC | PRN

## 2011-08-13 MED ORDER — ONDANSETRON HCL 4 MG/2ML IJ SOLN
INTRAMUSCULAR | Status: AC
  Filled 2011-08-13: qty 2

## 2011-08-13 MED ORDER — NALBUPHINE HCL 10 MG/ML IJ SOLN
5.0000 mg | INTRAMUSCULAR | Status: DC | PRN
  Filled 2011-08-13: qty 1

## 2011-08-13 MED ORDER — ATROPINE SULFATE 1 MG/ML IJ SOLN
INTRAMUSCULAR | Status: DC | PRN
  Administered 2011-08-13: 0.2 mg via INTRAVENOUS

## 2011-08-13 MED ORDER — EPHEDRINE 5 MG/ML INJ
INTRAVENOUS | Status: AC
  Filled 2011-08-13: qty 10

## 2011-08-13 MED ORDER — ATROPINE SULFATE 0.4 MG/ML IJ SOLN
INTRAMUSCULAR | Status: AC
  Filled 2011-08-13: qty 1

## 2011-08-13 MED ORDER — OXYTOCIN 10 UNIT/ML IJ SOLN
20.0000 [IU] | INTRAVENOUS | Status: DC | PRN
  Administered 2011-08-13: 20 [IU] via INTRAVENOUS

## 2011-08-13 MED ORDER — FENTANYL CITRATE 0.05 MG/ML IJ SOLN: 25.0000 ug | INTRAMUSCULAR | Status: DC | PRN

## 2011-08-13 MED ORDER — DIPHENHYDRAMINE HCL 25 MG PO CAPS
25.0000 mg | ORAL_CAPSULE | ORAL | Status: DC | PRN
  Filled 2011-08-13: qty 1

## 2011-08-13 MED ORDER — SODIUM CHLORIDE 0.9 % IR SOLN
Status: DC | PRN
  Administered 2011-08-13: 1000 mL

## 2011-08-13 MED ORDER — FENTANYL CITRATE 0.05 MG/ML IJ SOLN
INTRAMUSCULAR | Status: AC
  Filled 2011-08-13: qty 2

## 2011-08-13 MED ORDER — DIPHENHYDRAMINE HCL 50 MG/ML IJ SOLN
12.5000 mg | INTRAMUSCULAR | Status: DC | PRN
  Administered 2011-08-13: 12.5 mg via INTRAVENOUS

## 2011-08-13 MED ORDER — SCOPOLAMINE 1 MG/3DAYS TD PT72
1.0000 | MEDICATED_PATCH | Freq: Once | TRANSDERMAL | Status: DC
  Filled 2011-08-13: qty 1

## 2011-08-13 MED ORDER — LACTATED RINGERS IV SOLN
INTRAVENOUS | Status: DC
  Administered 2011-08-13 (×3): via INTRAVENOUS

## 2011-08-13 MED ORDER — PHENYLEPHRINE HCL 10 MG/ML IJ SOLN
INTRAMUSCULAR | Status: DC | PRN
  Administered 2011-08-13 (×2): 40 ug via INTRAVENOUS

## 2011-08-13 MED ORDER — DIPHENHYDRAMINE HCL 50 MG/ML IJ SOLN
INTRAMUSCULAR | Status: AC
  Administered 2011-08-13: 12.5 mg via INTRAVENOUS
  Filled 2011-08-13: qty 1

## 2011-08-13 MED ORDER — OXYTOCIN 10 UNIT/ML IJ SOLN
INTRAMUSCULAR | Status: AC
  Filled 2011-08-13: qty 4

## 2011-08-13 MED ORDER — LACTATED RINGERS IV SOLN
INTRAVENOUS | Status: DC
  Administered 2011-08-13: 19:00:00 via INTRAVENOUS

## 2011-08-13 MED ORDER — FENTANYL CITRATE 0.05 MG/ML IJ SOLN
INTRAMUSCULAR | Status: DC | PRN
  Administered 2011-08-13: 20 ug via INTRAVENOUS
  Administered 2011-08-13 (×2): 25 ug via INTRAVENOUS
  Administered 2011-08-13: 15 ug via INTRATHECAL

## 2011-08-13 MED ORDER — SCOPOLAMINE 1 MG/3DAYS TD PT72
MEDICATED_PATCH | TRANSDERMAL | Status: AC
  Administered 2011-08-13: 1.5 mg via TRANSDERMAL
  Filled 2011-08-13: qty 1

## 2011-08-13 MED ORDER — BUPIVACAINE IN DEXTROSE 0.75-8.25 % IT SOLN
INTRATHECAL | Status: DC | PRN
  Administered 2011-08-13: 1.5 mL via INTRATHECAL

## 2011-08-13 SURGICAL SUPPLY — 37 items
APL SKNCLS STERI-STRIP NONHPOA (GAUZE/BANDAGES/DRESSINGS) ×1
BENZOIN TINCTURE PRP APPL 2/3 (GAUZE/BANDAGES/DRESSINGS) ×2 IMPLANT
CHLORAPREP W/TINT 26ML (MISCELLANEOUS) ×2 IMPLANT
CLOTH BEACON ORANGE TIMEOUT ST (SAFETY) ×2 IMPLANT
CONTAINER PREFILL 10% NBF 15ML (MISCELLANEOUS) IMPLANT
DRESSING TELFA 8X3 (GAUZE/BANDAGES/DRESSINGS) ×1 IMPLANT
DRSG COVADERM 4X10 (GAUZE/BANDAGES/DRESSINGS) ×1 IMPLANT
ELECT REM PT RETURN 9FT ADLT (ELECTROSURGICAL) ×2
ELECTRODE REM PT RTRN 9FT ADLT (ELECTROSURGICAL) ×1 IMPLANT
EXTRACTOR VACUUM M CUP 4 TUBE (SUCTIONS) IMPLANT
GLOVE BIO SURGEON STRL SZ7.5 (GLOVE) ×4 IMPLANT
GLOVE BIOGEL PI IND STRL 7.5 (GLOVE) ×1 IMPLANT
GLOVE BIOGEL PI INDICATOR 7.5 (GLOVE) ×1
GOWN PREVENTION PLUS LG XLONG (DISPOSABLE) ×6 IMPLANT
KIT ABG SYR 3ML LUER SLIP (SYRINGE) IMPLANT
NDL HYPO 25X5/8 SAFETYGLIDE (NEEDLE) IMPLANT
NEEDLE HYPO 22GX1.5 SAFETY (NEEDLE) IMPLANT
NEEDLE HYPO 25X5/8 SAFETYGLIDE (NEEDLE) IMPLANT
NS IRRIG 1000ML POUR BTL (IV SOLUTION) ×2 IMPLANT
PACK C SECTION WH (CUSTOM PROCEDURE TRAY) ×2 IMPLANT
PAD ABD 7.5X8 STRL (GAUZE/BANDAGES/DRESSINGS) ×1 IMPLANT
RETRACTOR WND ALEXIS 25 LRG (MISCELLANEOUS) ×1 IMPLANT
RTRCTR WOUND ALEXIS 25CM LRG (MISCELLANEOUS) ×2
SLEEVE SCD COMPRESS KNEE MED (MISCELLANEOUS) IMPLANT
STRIP CLOSURE SKIN 1/2X4 (GAUZE/BANDAGES/DRESSINGS) ×2 IMPLANT
SUT CHROMIC 2 0 CT 1 (SUTURE) ×2 IMPLANT
SUT MNCRL AB 3-0 PS2 27 (SUTURE) ×2 IMPLANT
SUT PLAIN 0 NONE (SUTURE) IMPLANT
SUT PLAIN 2 0 XLH (SUTURE) ×2 IMPLANT
SUT VIC AB 0 CT1 36 (SUTURE) ×2 IMPLANT
SUT VIC AB 0 CTX 36 (SUTURE) ×6
SUT VIC AB 0 CTX36XBRD ANBCTRL (SUTURE) ×3 IMPLANT
SYR CONTROL 10ML LL (SYRINGE) IMPLANT
TAPE CLOTH SURG 4X10 WHT LF (GAUZE/BANDAGES/DRESSINGS) ×1 IMPLANT
TOWEL OR 17X24 6PK STRL BLUE (TOWEL DISPOSABLE) ×4 IMPLANT
TRAY FOLEY CATH 14FR (SET/KITS/TRAYS/PACK) ×2 IMPLANT
WATER STERILE IRR 1000ML POUR (IV SOLUTION) ×2 IMPLANT

## 2011-08-13 NOTE — Transfer of Care (Signed)
Immediate Anesthesia Transfer of Care Note  Patient: Rachael Wilson  Procedure(s) Performed: Procedure(s) (LRB): CESAREAN SECTION (N/A)  Patient Location: PACU  Anesthesia Type: Spinal  Level of Consciousness: awake, alert  and oriented  Airway & Oxygen Therapy: Patient Spontanous Breathing  Post-op Assessment: Report given to PACU RN and Post -op Vital signs reviewed and stable  Post vital signs: Reviewed and stable  Complications: No apparent anesthesia complications

## 2011-08-13 NOTE — Addendum Note (Signed)
Addendum  created 08/13/11 1958 by Len Blalock, CRNA   Modules edited:Notes Section

## 2011-08-13 NOTE — Op Note (Addendum)
Cesarean Section Procedure Note  Indications: Repeat C/S at 39 2/7wks  Pre-operative Diagnosis: Two Prior Cesarean Sections   Post-operative Diagnosis: two prior cesarean sections  Procedure: CESAREAN SECTION and LOA  Surgeon: Purcell Nails, MD    Assistants: Elsie Ra, CNM  Anesthesia: Spinal  Anesthesiologist: Dana Allan, MD  Procedure Details  The patient was taken to the operating room secondary to repeat c-section after the risks, benefits, complications, treatment options, and expected outcomes were discussed with the patient.  The patient concurred with the proposed plan, giving informed consent which was signed and witnessed. The patient was taken to Operating Room 1, identified as Rachael Wilson and the procedure verified as C-Section Delivery. A Time Out was held and the above information confirmed.  After induction of anesthesia by obtaining a spinal, the patient was prepped and draped in the usual sterile manner. A Pfannenstiel skin incision was made and carried down through the subcutaneous tissue to the underlying layer of fascia.  The fascia was incised bilaterally and extended transversely bilaterally with the Mayo scissors. Kocher clamps were placed on the inferior aspect of the fascial incision and the underlying rectus muscle was separated from the fascia. The same was done on the superior aspect of the fascial incision.  Scar tissue was incised and the peritoneum tented, entered sharply and extended manually. The Jon Gills was placed for self-retaining retraction.  The utero-vesical peritoneal reflection was incised transversely and the bladder flap was bluntly freed from the lower uterine segment. A low transverse uterine incision was made with the scalpel and extended bilaterally with the bandage scissors.  The infant was delivered in vertex position in direct OP presentation without difficulty.  After the umbilical cord was clamped and cut, the infant was handed to the  awaiting pediatricians.  Cord blood was obtained for evaluation.  The placenta was removed intact and appeared to be within normal limits. The uterus was cleared of all clots and debris. The uterine incision was closed with running interlocking sutures of 0 Vicryl and a second imbricating layer was performed as well.   Bilateral tubes and ovaries appeared to be within normal limits.  Two omenatal adhesions were excised from the anterior abdominal wall after clamping with two Kellys, cutting and ligating with ) vicryl.  Good hemostasis was noted.  Copious irrigation was performed until clear.  The peritoneum was repaired with 2-0 chromic via a running suture.  The fascia was reapproximated with a running suture of 0 Vicryl. The subcutaneous tissue was reapproximated with 3 interrupted sutures of 2-0 plain.  The skin was reapproximated with a subcuticular suture of 3-0 monocryl.  Steristrips were applied.  Instrument, sponge, and needle counts were correct prior to abdominal closure and at the conclusion of the case.  The patient was awaiting transfer to the recovery room in good condition.  Findings: Live female infant with Apgars 8 at one minute, 8 at five minutes and 9 at ten minutes.  Normal appearing bilateral ovaries and fallopian tubes were noted.  Estimated Blood Loss:          Drains: foley to gravity 300cc         Total IV Fluids:         Specimens to Pathology: Placenta         Complications:  None; patient tolerated the procedure well.         Disposition: PACU - hemodynamically stable.         Condition: stable  Attending Attestation: I performed the procedure.

## 2011-08-13 NOTE — H&P (Signed)
Rachael Wilson is a 31 y.o. female presenting for repeat C/S at 39w 2d gestation. History Pt reports no issues at the present time.  She reports nml fetal movement.  Denies UCs, ROM or bldg.   Hx present preg:  Entered care at 15wks.  Txed for pos chlamydia at NOB cultures.  Had neg quad screen and normal anatomy US at 19wks.  Pt with nml early GTT and baseline preeclampsia labs.  Remainder of pregnancy uneventful.  OB History    Grav Para Term Preterm Abortions TAB SAB Ect Mult Living   4 2 2  0 1 0 1 0 0 2     Past Medical History  Diagnosis Date  . Polycystic ovarian syndrome   . IBS (irritable bowel syndrome)   . Pregnancy induced hypertension     with G1  . Gestational diabetes     diet controlled  . Sleep apnea     h/o sleep apnea when overweight  Pt also with hx of pos ANA.   Past Surgical History  Procedure Date  . Cesarean section     x 2  . Dilation and curettage of uterus    Family History: family history includes Cancer in her maternal grandmother and Diabetes in her maternal grandfather, maternal grandmother, mother, paternal grandfather, and paternal grandmother.  There is no history of Anesthesia problems, and Hypotension, and Malignant hyperthermia, and Pseudochol deficiency, . Social History:  reports that she has never smoked. She has never used smokeless tobacco. She reports that she does not drink alcohol or use illicit drugs.  Review of Systems  Constitutional: Negative.   HENT: Negative.   Eyes: Negative.   Respiratory: Negative.   Cardiovascular: Negative.   Gastrointestinal: Negative.   Genitourinary: Negative.   Musculoskeletal: Negative.   Skin: Negative.   Neurological: Negative.   Endo/Heme/Allergies: Negative.   Psychiatric/Behavioral: Negative.       Blood pressure 111/67, pulse 103, temperature 98.2 F (36.8 C), temperature source Oral, resp. rate 16, last menstrual period 11/10/2010, SpO2 99.00%, not currently breastfeeding. Maternal  Exam:  Abdomen: Patient reports no abdominal tenderness. Fundal height is 40.    Introitus: not evaluated.     Fetal Exam Fetal Monitor Review: Mode: hand-held doppler probe.   Baseline rate: 152.      Physical Exam  Constitutional: She is oriented to person, place, and time. She appears well-developed and well-nourished.  HENT:  Head: Normocephalic and atraumatic.  Right Ear: External ear normal.  Left Ear: External ear normal.  Nose: Nose normal.  Eyes: Conjunctivae are normal. Pupils are equal, round, and reactive to light.  Neck: Normal range of motion. Neck supple. No thyromegaly present.  Cardiovascular: Normal rate, regular rhythm and normal heart sounds.   Respiratory: Effort normal and breath sounds normal.  GI: Soft. Bowel sounds are normal.  Genitourinary:       Deferred  Musculoskeletal: Normal range of motion.  Neurological: She is alert and oriented to person, place, and time. She has normal reflexes.  Skin: Skin is warm and dry.  Psychiatric: She has a normal mood and affect. Her behavior is normal.    Prenatal labs: ABO, Rh: A/Positive/-- (12/14 0000) Antibody: Positive (12/14 0000) Rubella: Immune (12/14 0000) RPR: NON REACTIVE (05/20 1158)  HBsAg: Negative (12/14 0000)  HIV: Non-reactive, Non-reactive (12/14 0000)  GBS: NEGATIVE (05/02 1717)   Assessment/Plan: IUP at 39w 2d Repeat C/S  Admit per same day surgery per consult with Dr. Su Hilt. RBA C/S again d/w pt,  including infection, blood loss, damage to adjacent organs and desires to proceed.  Pt declines BTL at this time and plans postpartum IUD placement.    SMITH,NONA O. 08/13/2011, 9:27 AM   Agree with above.  R/B/A discussed incl but not limited to B/I/I.  Questions answered.  Pt declined BTL and will get IUD.

## 2011-08-13 NOTE — Anesthesia Postprocedure Evaluation (Signed)
  Anesthesia Post-op Note  Patient: Rachael Wilson  Procedure(s) Performed: Procedure(s) (LRB): CESAREAN SECTION (N/A)  Patient Location: PACU and Mother/Baby  Anesthesia Type: Spinal  Level of Consciousness: awake, alert  and oriented  Airway and Oxygen Therapy: Patient Spontanous Breathing    Post-op Assessment: Patient's Cardiovascular Status Stable and Respiratory Function Stable  Post-op Vital Signs: stable  Complications: No apparent anesthesia complications

## 2011-08-13 NOTE — Anesthesia Procedure Notes (Signed)
Spinal  Patient location during procedure: OR Start time: 08/13/2011 10:02 AM Staffing Performed by: anesthesiologist  Preanesthetic Checklist Completed: patient identified, site marked, surgical consent, pre-op evaluation, timeout performed, IV checked, risks and benefits discussed and monitors and equipment checked Spinal Block Patient position: sitting Prep: site prepped and draped and DuraPrep Patient monitoring: heart rate, cardiac monitor, continuous pulse ox and blood pressure Approach: midline Location: L3-4 Injection technique: single-shot Needle Needle type: Sprotte  Needle gauge: 24 G Needle length: 9 cm Assessment Sensory level: T4 Additional Notes Clear free flow CSF on first attempt.  No paresthesia.  Patient tolerated procedure well. Jasmine December, MD

## 2011-08-13 NOTE — Anesthesia Preprocedure Evaluation (Signed)
Anesthesia Evaluation  Patient identified by MRN, date of birth, ID band Patient awake    Reviewed: Allergy & Precautions, H&P , NPO status , Patient's Chart, lab work & pertinent test results, reviewed documented beta blocker date and time   History of Anesthesia Complications Negative for: history of anesthetic complications  Airway Mallampati: III TM Distance: >3 FB Neck ROM: full    Dental  (+) Teeth Intact   Pulmonary neg pulmonary ROS,  breath sounds clear to auscultation        Cardiovascular negative cardio ROS  Rhythm:regular Rate:Normal     Neuro/Psych negative neurological ROS  negative psych ROS   GI/Hepatic negative GI ROS, Neg liver ROS,   Endo/Other  Diabetes mellitus- (h/o gestational DM in prior pregnancy)Morbid obesity  Renal/GU negative Renal ROS  negative genitourinary   Musculoskeletal   Abdominal   Peds  Hematology negative hematology ROS (+)   Anesthesia Other Findings   Reproductive/Obstetrics (+) Pregnancy (h/o c/sx2, for repeat)                           Anesthesia Physical Anesthesia Plan  ASA: II  Anesthesia Plan: Spinal   Post-op Pain Management:    Induction:   Airway Management Planned:   Additional Equipment:   Intra-op Plan:   Post-operative Plan:   Informed Consent: I have reviewed the patients History and Physical, chart, labs and discussed the procedure including the risks, benefits and alternatives for the proposed anesthesia with the patient or authorized representative who has indicated his/her understanding and acceptance.     Plan Discussed with: Surgeon and CRNA  Anesthesia Plan Comments:         Anesthesia Quick Evaluation

## 2011-08-13 NOTE — OR Nursing (Signed)
Uterus massaged by S. Anamari Galeas RN.  Two tubes of cord blood to lab.  

## 2011-08-13 NOTE — Anesthesia Postprocedure Evaluation (Signed)
Anesthesia Post Note  Patient: Rachael Wilson  Procedure(s) Performed: Procedure(s) (LRB): CESAREAN SECTION (N/A)  Anesthesia type: Spinal  Patient location: PACU  Post pain: Pain level controlled  Post assessment: Post-op Vital signs reviewed  Last Vitals:  Filed Vitals:   08/13/11 1315  BP: 115/70  Pulse: 83  Temp:   Resp: 16    Post vital signs: Reviewed  Level of consciousness: awake  Complications: No apparent anesthesia complications

## 2011-08-14 LAB — CBC
HCT: 27.3 % — ABNORMAL LOW (ref 36.0–46.0)
Hemoglobin: 9.1 g/dL — ABNORMAL LOW (ref 12.0–15.0)
RBC: 3.06 MIL/uL — ABNORMAL LOW (ref 3.87–5.11)
WBC: 6.6 10*3/uL (ref 4.0–10.5)

## 2011-08-14 MED ORDER — SENNOSIDES-DOCUSATE SODIUM 8.6-50 MG PO TABS
2.0000 | ORAL_TABLET | Freq: Every evening | ORAL | Status: DC | PRN
  Administered 2011-08-14: 2 via ORAL

## 2011-08-14 MED ORDER — OXYCODONE-ACETAMINOPHEN 5-325 MG PO TABS
1.0000 | ORAL_TABLET | ORAL | Status: DC | PRN
  Administered 2011-08-14: 2 via ORAL
  Administered 2011-08-14 (×3): 1 via ORAL
  Administered 2011-08-14 (×2): 2 via ORAL
  Administered 2011-08-15 (×3): 1 via ORAL
  Administered 2011-08-15: 2 via ORAL
  Administered 2011-08-15: 1 via ORAL
  Administered 2011-08-16 (×2): 2 via ORAL
  Administered 2011-08-16: 1 via ORAL
  Filled 2011-08-14: qty 2
  Filled 2011-08-14: qty 1
  Filled 2011-08-14 (×6): qty 2
  Filled 2011-08-14 (×6): qty 1

## 2011-08-14 MED ORDER — BISACODYL 10 MG RE SUPP: 10.0000 mg | Freq: Every day | RECTAL | Status: DC | PRN

## 2011-08-14 NOTE — Progress Notes (Signed)
Subjective: Postpartum Day 1: Cesarean Delivery Patient reports incisional pain, tolerating PO, + flatus and no problems voiding.  BF'ng newborn on my arrival to room.  No cbc drawn this morning.  C/o of abdomen being sore, and struggling w/ moving newborn around and working on better positions while breastfeeding to help w/ latch and prevent more soreness.  No visitors at bedside.    Objective: Vital signs in last 24 hours: Temp:  [98 F (36.7 C)-98.5 F (36.9 C)] 98.2 F (36.8 C) (05/26 1316) Pulse Rate:  [75-99] 79  (05/26 1316) Resp:  [16-20] 18  (05/26 1316) BP: (102-120)/(55-76) 112/59 mmHg (05/26 1316) SpO2:  [98 %-100 %] 100 % (05/26 0800)  Physical Exam:  General: alert, cooperative and no distress Lochia: appropriate, rubra Uterine Fundus: firm, u/-1 Incision: SS c/d/i; abdomen appropriately tender DVT Evaluation: No evidence of DVT seen on physical exam. Negative Homan's sign. Calf/Ankle edema is present.  No results found for this basename: HGB:2,HCT:2 in the last 72 hours  Assessment/Plan: Status post repeat Cesarean section. Doing well postoperatively.  Continue current care.  Previous c/sx2. Lactating.  H/o positive ANA.  IBS.  H/o PCOS. Planning IUD PP. Lactation support.  Enc'd ambulation today.    Rachael Wilson H 08/14/2011, 1:24 PM

## 2011-08-15 ENCOUNTER — Encounter (HOSPITAL_COMMUNITY): Payer: Self-pay | Admitting: Obstetrics and Gynecology

## 2011-08-15 NOTE — Progress Notes (Signed)
UR chart review completed.  

## 2011-08-15 NOTE — Progress Notes (Signed)
Subjective: Postpartum Day 2: Cesarean Delivery Patient reports feeling OK.  Sleepy due to not sleeping at night.  Ambulating, voiding and tol po liquids and solids without difficulty.  Pos flatus.  Neg BM.  Denies weakness or dizziness.  Breastfeeding without difficulty.    Objective: Vital signs in last 24 hours: Temp:  [98.2 F (36.8 C)-98.4 F (36.9 C)] 98.4 F (36.9 C) (05/27 1338) Pulse Rate:  [82-93] 82  (05/27 1338) Resp:  [18] 18  (05/27 1338) BP: (95-120)/(60-79) 120/79 mmHg (05/27 1338) SpO2:  [98 %] 98 % (05/27 1338)  Physical Exam:  General: alert, cooperative and no distress Heart:  RRR Lungs:  CTA bilat Breasts:  Soft Abd:  Soft, NT, pos BS x 4 quads Lochia: appropriate, sm rubra Uterine Fundus: firm, NT, 1 below umb. Incision: healing well, steri strips intact.  No redness or drainage DVT Evaluation: No evidence of DVT seen on physical exam. Negative Homan's sign bilat. No significant calf/ankle edema.   Basename 08/14/11 1354  HGB 9.1*  HCT 27.3*    Assessment/Plan: Status post Cesarean section. Doing well postoperatively.  Continue current care.  Berdia Lachman O. 08/15/2011, 4:40 PM

## 2011-08-16 MED ORDER — SENNOSIDES-DOCUSATE SODIUM 8.6-50 MG PO TABS: 2.0000 | ORAL_TABLET | Freq: Every evening | ORAL | Status: AC | PRN

## 2011-08-16 MED ORDER — FERROUS SULFATE 325 (65 FE) MG PO TABS: 325.0000 mg | ORAL_TABLET | Freq: Two times a day (BID) | ORAL | Status: DC

## 2011-08-16 MED ORDER — TETANUS-DIPHTH-ACELL PERTUSSIS 5-2.5-18.5 LF-MCG/0.5 IM SUSP
0.5000 mL | Freq: Once | INTRAMUSCULAR | Status: AC
  Administered 2011-08-16: 0.5 mL via INTRAMUSCULAR
  Filled 2011-08-16: qty 0.5

## 2011-08-16 MED ORDER — OXYCODONE-ACETAMINOPHEN 5-325 MG PO TABS: 1.0000 | ORAL_TABLET | ORAL | Status: AC | PRN

## 2011-08-16 MED ORDER — IBUPROFEN 600 MG PO TABS: 600.0000 mg | ORAL_TABLET | Freq: Four times a day (QID) | ORAL | Status: AC | PRN

## 2011-08-16 NOTE — Discharge Instructions (Signed)
Anemia, Frequently Asked Questions WHAT ARE THE SYMPTOMS OF ANEMIA?  Headache.   Difficulty thinking.   Fatigue.   Shortness of breath.   Weakness.   Rapid heartbeat.  AT WHAT POINT ARE PEOPLE CONSIDERED ANEMIC?  This varies with gender and age.   Both hemoglobin (Hgb) and hematocrit values are used to define anemia. These lab values are obtained from a complete blood count (CBC) test. This is performed at a caregiver's office.   The normal range of hemoglobin values for adult men is 14.0 g/dL to 17.4 g/dL. For nonpregnant women, values are 12.3 g/dL to 15.3 g/dL.   The World Health Organization defines anemia as less than 12 g/dL for nonpregnant women and less than 13 g/dL for men.   For adult males, the average normal hematocrit is 46%, and the range is 40% to 52%.   For adult females, the average normal hematocrit is 41%, and the range is 35% to 47%.   Values that fall below the lower limits can be a sign of anemia and should have further checking (evaluation).  GROUPS OF PEOPLE WHO ARE AT RISK FOR DEVELOPING ANEMIA INCLUDE:   Infants who are breastfed or taking a formula that is not fortified with iron.   Children going through a rapid growth spurt. The iron available can not keep up with the needs for a red cell mass which must grow with the child.   Women in childbearing years. They need iron because of blood loss during menstruation.   Pregnant women. The growing fetus creates a high demand for iron.   People with ongoing gastrointestinal blood loss are at risk of developing iron deficiency.   Individuals with leukemia or cancer who must receive chemotherapy or radiation to treat their disease. The drugs or radiation used to treat these diseases often decreases the bone marrow's ability to make cells of all classes. This includes red blood cells, white blood cells, and platelets.   Individuals with chronic inflammatory conditions such as rheumatoid arthritis or  chronic infections.   The elderly.  ARE SOME TYPES OF ANEMIA INHERITED?   Yes, some types of anemia are due to inherited or genetic defects.   Sickle cell anemia. This occurs most often in people of African, African American, and Mediterranean descent.   Thalassemia (or Cooley's anemia). This type is found in people of Mediterranean and Southeast Asian descent. These types of anemia are common.   [place]. This is rare.  CAN CERTAIN MEDICATIONS CAUSE A PERSON TO BECOME ANEMIC?  Yes. For example, drugs to fight cancer (chemotherapeutic agents) often cause anemia. These drugs can slow the bone marrow's ability to make red blood cells. If there are not enough red blood cells, the body does not get enough oxygen. WHAT HEMATOCRIT LEVEL IS REQUIRED TO DONATE BLOOD?  The lower limit of an acceptable hematocrit for blood donors is 38%. If you have a low hematocrit value, you should schedule an appointment with your caregiver. ARE BLOOD TRANSFUSIONS COMMONLY USED TO CORRECT ANEMIA, AND ARE THEY DANGEROUS?  They are used to treat anemia as a last resort. Your caregiver will find the cause of the anemia and correct it if possible. Most blood transfusions are given because of excessive bleeding at the time of surgery, with trauma, or because of bone marrow suppression in patients with cancer or leukemia on chemotherapy. Blood transfusions are safer than ever before. We also know that blood transfusions affect the immune system and may increase certain risks. There is   also a concern for human error. In 1/16,000 transfusions, a patient receives a transfusion of blood that is not matched with his or her blood type.  WHAT IS IRON DEFICIENCY ANEMIA AND CAN I CORRECT IT BY CHANGING MY DIET?  Iron is an essential part of hemoglobin. Without enough hemoglobin, anemia develops and the body does not get the right amount of oxygen. Iron deficiency anemia develops after the body has had a low level of iron for a long  time. This is either caused by blood loss, not taking in or absorbing enough iron, or increased demands for iron (like pregnancy or rapid growth).  Foods from animal origin such as beef, chicken, and pork, are good sources of iron. Be sure to have one of these foods at each meal. Vitamin C helps your body absorb iron. Foods rich in Vitamin C include citrus, bell pepper, strawberries, spinach and cantaloupe. In some cases, iron supplements may be needed in order to correct the iron deficiency. In the case of poor absorption, extra iron may have to be given directly into the vein through a needle (intravenously). I HAVE BEEN DIAGNOSED WITH IRON DEFICIENCY ANEMIA AND MY CAREGIVER PRESCRIBED IRON SUPPLEMENTS. HOW LONG WILL IT TAKE FOR MY BLOOD TO BECOME NORMAL?  It depends on the degree of anemia at the beginning of treatment. Most people with mild to moderate iron deficiency, anemia will correct the anemia over a period of 2 to 3 months. But after the anemia is corrected, the iron stored by the body is still low. Caregivers often suggest an additional 6 months of oral iron therapy once the anemia has been reversed. This will help prevent the iron deficiency anemia from quickly happening again. Non-anemic adult males should take iron supplements only under the direction of a doctor, too much iron can cause liver damage.  MY HEMOGLOBIN IS 9 G/DL AND I AM SCHEDULED FOR SURGERY. SHOULD I POSTPONE THE SURGERY?  If you have Hgb of 9, you should discuss this with your caregiver right away. Many patients with similar hemoglobin levels have had surgery without problems. If minimal blood loss is expected for a minor procedure, no treatment may be necessary.  If a greater blood loss is expected for more extensive procedures, you should ask your caregiver about being treated with erythropoietin and iron. This is to accelerate the recovery of your hemoglobin to a normal level before surgery. An anemic patient who undergoes  high-blood-loss surgery has a greater risk of surgical complications and need for a blood transfusion, which also carries some risk.  I HAVE BEEN TOLD THAT HEAVY MENSTRUAL PERIODS CAUSE ANEMIA. IS THERE ANYTHING I CAN DO TO PREVENT THE ANEMIA?  Anemia that results from heavy periods is usually due to iron deficiency. You can try to meet the increased demands for iron caused by the heavy monthly blood loss by increasing the intake of iron-rich foods. Iron supplements may be required. Discuss your concerns with your caregiver. WHAT CAUSES ANEMIA DURING PREGNANCY?  Pregnancy places major demands on the body. The mother must meet the needs of both her body and her growing baby. The body needs enough iron and folate to make the right amount of red blood cells. To prevent anemia while pregnant, the mother should stay in close contact with her caregiver.  Be sure to eat a diet that has foods rich in iron and folate like liver and dark green leafy vegetables. Folate plays an important role in the normal development of a baby's   spinal cord. Folate can help prevent serious disorders like spina bifida. If your diet does not provide adequate nutrients, you may want to talk with your caregiver about nutritional supplements.  WHAT IS THE RELATIONSHIP BETWEEN FIBROID TUMORS AND ANEMIA IN WOMEN?  The relationship is usually caused by the increased menstrual blood loss caused by fibroids. Good iron intake may be required to prevent iron deficiency anemia from developing.  Document Released: 10/14/2003 Document Revised: 02/24/2011 Document Reviewed: 03/30/2010 [facility] Patient Information 2012 [place], [place]. Breastfeeding BENEFITS OF BREASTFEEDING For the baby  The first milk (colostrum) helps the baby's digestive system function better.   There are antibodies from the mother in the milk that help the baby fight off infections.   The baby has a lower incidence of asthma, allergies, and SIDS (sudden infant death  syndrome).   The nutrients in breast milk are better than formulas for the baby and helps the baby's brain grow better.   Babies who breastfeed have less gas, colic, and constipation.  For the mother  Breastfeeding helps develop a very special bond between mother and baby.   It is more convenient, always available at the correct temperature and cheaper than formula feeding.   It burns calories in the mother and helps with losing weight that was gained during pregnancy.   It makes the uterus contract back down to normal size faster and slows bleeding following delivery.   Breastfeeding mothers have a lower risk of developing breast cancer.  NURSE FREQUENTLY  A healthy, full-term baby may breastfeed as often as every hour or space his or her feedings to every 3 hours.   How often to nurse will vary from baby to baby. Watch your baby for signs of hunger, not the clock.   Nurse as often as the baby requests, or when you feel the need to reduce the fullness of your breasts.   Awaken the baby if it has been 3 to 4 hours since the last feeding.   Frequent feeding will help the mother make more milk and will prevent problems like sore nipples and engorgement of the breasts.  BABY'S POSITION AT THE BREAST  Whether lying down or sitting, be sure that the baby's tummy is facing your tummy.   Support the breast with 4 fingers underneath the breast and the thumb above. Make sure your fingers are well away from the nipple and baby's mouth.   Stroke the baby's lips and cheek closest to the breast gently with your finger or nipple.   When the baby's mouth is open wide enough, place all of your nipple and as much of the dark area around the nipple as possible into your baby's mouth.   Pull the baby in close so the tip of the nose and the baby's cheeks touch the breast during the feeding.  FEEDINGS  The length of each feeding varies from baby to baby and from feeding to feeding.   The baby  must suck about 2 to 3 minutes for your milk to get to him or her. This is called a "let down." For this reason, allow the baby to feed on each breast as long as he or she wants. Your baby will end the feeding when he or she has received the right balance of nutrients.   To break the suction, put your finger into the corner of the baby's mouth and slide it between his or her gums before removing your breast from his or her mouth. This will  help prevent sore nipples.  REDUCING BREAST ENGORGEMENT  In the first week after your baby is born, you may experience signs of breast engorgement. When breasts are engorged, they feel heavy, warm, full, and may be tender to the touch. You can reduce engorgement if you:   Nurse frequently, every 2 to 3 hours. Mothers who breastfeed early and often have fewer problems with engorgement.   Place light ice packs on your breasts between feedings. This reduces swelling. Wrap the ice packs in a lightweight towel to protect your skin.   Apply moist hot packs to your breast for 5 to 10 minutes before each feeding. This increases circulation and helps the milk flow.   Gently massage your breast before and during the feeding.   Make sure that the baby empties at least one breast at every feeding before switching sides.   Use a breast pump to empty the breasts if your baby is sleepy or not nursing well. You may also want to pump if you are returning to work or or you feel you are getting engorged.   Avoid bottle feeds, pacifiers or supplemental feedings of water or juice in place of breastfeeding.   Be sure the baby is latched on and positioned properly while breastfeeding.   Prevent fatigue, stress, and anemia.   Wear a supportive bra, avoiding underwire styles.   Eat a balanced diet with enough fluids.  If you follow these suggestions, your engorgement should improve in 24 to 48 hours. If you are still experiencing difficulty, call your lactation consultant or  caregiver. IS MY BABY GETTING ENOUGH MILK? Sometimes, mothers worry about whether their babies are getting enough milk. You can be assured that your baby is getting enough milk if:  The baby is actively sucking and you hear swallowing.   The baby nurses at least 8 to 12 times in a 24 hour time period. Nurse your baby until he or she unlatches or falls asleep at the first breast (at least 10 to 20 minutes), then offer the second side.   The baby is wetting 5 to 6 disposable diapers (6 to 8 cloth diapers) in a 24 hour period by 36 to 37 days of age.   The baby is having at least 2 to 3 stools every 24 hours for the first few months. Breast milk is all the food your baby needs. It is not necessary for your baby to have water or formula. In fact, to help your breasts make more milk, it is best not to give your baby supplemental feedings during the early weeks.   The stool should be soft and yellow.   The baby should gain 4 to 7 ounces per week after he is 27 days old.  TAKE CARE OF YOURSELF Take care of your breasts by:  Bathing or showering daily.   Avoiding the use of soaps on your nipples.   Start feedings on your left breast at one feeding and on your right breast at the next feeding.   You will notice an increase in your milk supply 2 to 5 days after delivery. You may feel some discomfort from engorgement, which makes your breasts very firm and often tender. Engorgement "peaks" out within 24 to 48 hours. In the meantime, apply warm moist towels to your breasts for 5 to 10 minutes before feeding. Gentle massage and expression of some milk before feeding will soften your breasts, making it easier for your baby to latch on. Wear a well  fitting nursing bra and air dry your nipples for 10 to 15 minutes after each feeding.   Only use cotton bra pads.   Only use pure lanolin on your nipples after nursing. You do not need to wash it off before nursing.  Take care of yourself by:   Eating  well-balanced meals and nutritious snacks.   Drinking milk, fruit juice, and water to satisfy your thirst (about 8 glasses a day).   Getting plenty of rest.   Increasing calcium in your diet (1200 mg a day).   Avoiding foods that you notice affect the baby in a bad way.  SEEK MEDICAL CARE IF:   You have any questions or difficulty with breastfeeding.   You need help.   You have a hard, red, sore area on your breast, accompanied by a fever of 100.5 F (38.1 C) or more.   Your baby is too sleepy to eat well or is having trouble sleeping.   Your baby is wetting less than 6 diapers per day, by 30 days of age.   Your baby's skin or white part of his or her eyes is more yellow than it was in the hospital.   You feel depressed.  Document Released: 03/07/2005 Document Revised: 02/24/2011 Document Reviewed: 10/20/2008 [facility] Patient Information 2012 [place], [place]. Cesarean Delivery Care After Refer to this sheet in the next few weeks. These instructions provide you with information on caring for yourself after your procedure. Your caregiver may also give you more specific instructions. Your treatment has been planned according to current medical practices, but problems sometimes occur. Call your caregiver if you have any problems or questions after your procedure. HOME CARE INSTRUCTIONS Healing will take time. You will have discomfort, tenderness, swelling, and bruising at the surgery site for a couple of weeks. This is normal and will get better as time goes on. Activity  Rest as much as possible the first 2 weeks.   When possible, have someone help you with your household activities and your baby for 2 to 3 weeks.   Limit your housework and social activity. Increase your activity gradually as your strength returns.   Do not climb stairs more than 2 to 3 times a day.   Do not lift anything heavier than your baby.   Follow your caregiver's instructions about driving a car.    Exercise only as directed by your caregiver.  Nutrition  You may return to your usual diet. Eat a well-balanced diet.   Drink enough fluid to keep your urine clear or pale yellow.   Keep taking your prenatal or multivitamins.   Do not drink alcohol until your caregiver says it is okay.  Elimination You should return to your usual bowel function. If you develop constipation, ask your caregiver about taking a mild laxative that will help you go to the bathroom. Bran foods and fluids help with constipation. Gradually add fruit, vegetables, and bran to your diet.  Hygiene  You may shower, wash your hair, and take tub baths unless your caregiver tells you otherwise.   Continue perineal care until your vaginal bleeding and discharge stops.   Do not douche or use tampons until your caregiver says it is okay.  Fever If you feel feverish or have shaking chills, take your temperature. The fever may indicate infection. Infections can be treated with antibiotic medicine. Pain Control and Medicine  Only take over-the-counter or prescription medicine as directed by your caregiver. Do not take aspirin. It can  cause bleeding.   Do not drive when taking pain medicine.   Talk to your caregiver about restarting or adjusting your normal medicines.  Incision Care  Clean your cut (incision) gently with soap and water, then pat dry.   If your caregiver says it is okay, leave the incision without a bandage (dressing) unless it is draining fluid or irritated.   If you have small adhesive strips across the incision and they do not fall off within 7 days, carefully peel them off.   Check the incision daily for increased redness, drainage, swelling, or separation of skin.   Hug a pillow when coughing or sneezing. This helps to relieve pain.  Vaginal Care You may have a vaginal discharge or bleeding for up to 6 weeks. If the vaginal discharge becomes bright red, bad smelling, heavy in amount, has  blood clots, or if you have burning or frequent urination, call your caregiver.If your bleeding slows down and then gets heavier, your body is telling you to slow down and relax more. Sexual Intercourse  Check with your caregiver before resuming sexual activity. Often, after 4 to 6 weeks, if you feel good and are well rested, sexual activity may be resumed. Avoid positions that strain the incision site.   You can become pregnant before you have a period. If you decide to have sexual intercourse, use birth control if you do not want to become pregnant right away.  Health Practices  Keep all your postpartum appointments as recommended by your caregiver. Generally, your caregiver will want to see you in 2 to 3 weeks.   Continue with your yearly pelvic exams.   Continue monthly self-breast exams and yearly physical exams with a Pap test.  Breast Care  If you are not breastfeeding and your breasts become tender, hard, or leak milk, you may wear a tight-fitting bra and apply ice to your breasts.   If you are breastfeeding, wear a good support bra.   Call your caregiver if you have breast pain, flu-like symptoms, fever, or hardness and reddening of your breasts.  Postpartum Blues You may have a period of low spirits or "blues" after your baby is born. Discuss your feelings with your partner, family, and friends. This may be caused by the changing hormone levels in your body. You may want to contact your caregiver if this is worrisome. Miscellaneous  Limit wearing support panties or control-top hose.   If you breastfeed, you may not have a period for several months or longer. This is normal for the nursing mother. If you do not menstruate within 6 weeks after you stop breastfeeding, see your caregiver.   If you are not breastfeeding, you can expect to menstruate within 6 to 10 weeks after birth. If you have not started by the 11th week, check with your caregiver.  SEEK MEDICAL CARE IF:    There is swelling, redness, or increasing pain in the wound area.   You have pus coming from the wound.   You notice a bad smell from the wound or surgical dressing.   You have pain, redness, and swelling from the intravenous (IV) site.   Your wound breaks open (the edges are not staying together).   You feel dizzy or feel like fainting.   You develop pain or bleeding when you urinate.   You develop diarrhea.   You develop nausea and vomiting.   You develop abnormal vaginal discharge.   You develop a rash.   You have any  type of abnormal reaction or develop an allergy to your medicine.   Your pain is not relieved by your medicine or becomes worse.   Your temperature is 101 F (38.3 C), or is 100.4 F (38 C) taken 2 times in a 4 hour period.  SEEK IMMEDIATE MEDICAL CARE:  You develop a temperature of 102 F (38.9 C) or higher.   You develop abdominal pain.   You develop chest pain.   You develop shortness of breath.   You faint.   You develop pain, swelling, or redness of your leg.   You develop heavy vaginal bleeding with or without blood clots.  Document Released: 11/27/2001 Document Revised: 02/24/2011 Document Reviewed: 06/02/2010 [facility] Patient Information 2012 [place], [place].

## 2011-08-16 NOTE — Discharge Summary (Signed)
Physician Discharge Summary  Patient ID: Rachael Wilson MRN: [id number] DOB/AGE: October 29, 1980 31 y.o.  Admit date: 08/13/2011 Discharge date: 08/16/2011  Admission Diagnoses: term pg for repeat c/s  Discharge Diagnoses: term pg  repeat c/s Active Problems:  * No active hospital problems. *  anemia lactating Discharged Condition: stable  Hospital Course: term pg repeat c/s, normal involution, anemia  Consults: None  Significant Diagnostic Studies: labs:  Hemoglobin & Hematocrit     Component Value Date/Time   HGB 9.1* 08/14/2011 1354   HCT 27.3* 08/14/2011 1354     Treatments: IV hydration  Discharge Exam: Blood pressure 109/70, pulse 103, temperature 98.4 F (36.9 C), temperature source Oral, resp. rate 20, weight 214 lb (97.07 kg), last menstrual period 11/10/2010, SpO2 98.00%, unknown if currently breastfeeding. General appearance: alert, cooperative and no distress Subjective: Postpartum Day 3: Cesarean Delivery Patient reports tolerating PO and no problems voiding, +flatus.    Objective:   Vital signs in last 24 hours:  Physical Exam:  General: alert, cooperative and no distress Lochia: appropriate Uterine Fundus: firm Incision: healing well DVT Evaluation: Negative Homan's sign. Lungs clear bilaterally AP RRR Bowel sounds active abd.active, abd soft, nt,  S: comfortable, little bleeding, slept     feeding A normal involution     Lactating     PO day      Normal involution P continue care Lavera Guise, CNM  Disposition: 01-Home or Self Care  Discharge Orders    Future Appointments: Provider: Department: Dept Phone: Center:   09/23/2011 3:15 PM Michael Litter, MD [facility] [phone] None     Medication List  As of 08/16/2011 12:05 PM   TAKE these medications         ferrous sulfate 325 (65 FE) MG tablet   Take 1 tablet (325 mg total) by mouth 2 (two) times daily.      ibuprofen 600 MG tablet   Commonly known as: ADVIL,MOTRIN   Take 1  tablet (600 mg total) by mouth every 6 (six) hours as needed.      oxyCODONE-acetaminophen 5-325 MG per tablet   Commonly known as: PERCOCET   Take 1-2 tablets by mouth every 4 (four) hours as needed.      prenatal multivitamin Tabs   Take 1 tablet by mouth daily.      senna-docusate 8.6-50 MG per tablet   Commonly known as: Senokot-S   Take 2 tablets by mouth at bedtime as needed.           Follow-up Information    Follow up with [facility] in 5 weeks.         SignedLavera Guise 08/16/2011, 12:05 PM

## 2011-08-17 LAB — TYPE AND SCREEN
Antibody Screen: POSITIVE
PT AG Type: NEGATIVE
Unit division: 0
Unit tag comment: NEGATIVE
Unit tag comment: NEGATIVE

## 2011-09-23 ENCOUNTER — Ambulatory Visit: Payer: Medicaid Other | Admitting: Obstetrics and Gynecology

## 2011-10-06 ENCOUNTER — Telehealth: Payer: Self-pay | Admitting: Obstetrics and Gynecology

## 2011-10-06 ENCOUNTER — Ambulatory Visit: Payer: Medicaid Other | Admitting: Obstetrics and Gynecology

## 2011-10-06 NOTE — Telephone Encounter (Signed)
Spoke with pt rgd msg pt states wants appt for today for ppv and uti sx advised pt had appt at 2:00 with avs why she didn't keep appt pt states non of my business its personal reason why didn't keep appt offered pt an appt for Monday with ar pt declined appt states she has to work wants appt for today advised pt no appt today offered pt an appt for Wednesday pt declined appt rudely stated she's on the way to office to be seen and hung up

## 2011-10-11 ENCOUNTER — Telehealth: Payer: Self-pay | Admitting: Obstetrics and Gynecology

## 2011-10-12 ENCOUNTER — Encounter: Payer: Self-pay | Admitting: Obstetrics and Gynecology

## 2011-10-12 ENCOUNTER — Ambulatory Visit (INDEPENDENT_AMBULATORY_CARE_PROVIDER_SITE_OTHER): Payer: Medicaid Other | Admitting: Obstetrics and Gynecology

## 2011-10-12 VITALS — BP 150/90 | Resp 16 | Ht 63.0 in | Wt 188.0 lb

## 2011-10-12 DIAGNOSIS — F53 Postpartum depression: Secondary | ICD-10-CM | POA: Insufficient documentation

## 2011-10-12 DIAGNOSIS — IMO0002 Reserved for concepts with insufficient information to code with codable children: Secondary | ICD-10-CM

## 2011-10-12 DIAGNOSIS — N3090 Cystitis, unspecified without hematuria: Secondary | ICD-10-CM

## 2011-10-12 LAB — POCT URINALYSIS DIPSTICK
Bilirubin, UA: NEGATIVE
Glucose, UA: NEGATIVE
Ketones, UA: NEGATIVE
Leukocytes, UA: NEGATIVE
Protein, UA: NEGATIVE
Spec Grav, UA: <=1.005

## 2011-10-12 NOTE — Progress Notes (Signed)
Date of delivery: 08/13/11 Female Name: Rachael Wilson Vaginal delivery:no Cesarean section:yes Tubal ligation:no GDM:no Breast Feeding:yes Bottle Feeding:no Post-Partum Blues:yes Abnormal pap:no Normal GU function: yes Normal GI function:yes Returning to work:yes, returned to work at 5 wks postpartum EPDS: 17  Pt wants to get nex-planon at next visit. C/O: stress and some depression,? Bladder infection. Subjective:     Rachael Wilson is a 31 y.o. female who presents for a postpartum visit.  I have fully reviewed the prenatal and intrapartum course. S/p repeat C/S 08/13/11    Patient has not recommenced sexually activity.   The following portions of the patient's history were reviewed and updated as appropriate: allergies, current medications, past family history, past medical history, past social history, past surgical history and problem list.  Review of Systems Pertinent items are noted in HPI.  Has not Objective:    BP 150/90  Resp 16  Ht 5\' 3"  (1.6 m)  Wt 188 lb (85.276 kg)  BMI 33.30 kg/m2  LMP 10/03/2011  Breastfeeding? Yes  General:  alert, cooperative and no distress     Lungs: clear to auscultation bilaterally  Heart:  regular rate and rhythm, S1, S2 normal, no murmur  Abdomen: soft, non-tender; bowel sounds normal; no masses,  no organomegaly, Incision site dry and healing, no tenderness    Vulva:  normal  Vagina: normal vagina  Cervix:  normal  Corpus: normal size, contour, position, consistency, mobility, non-tender  Adnexa:  normal adnexa             Assessment:     Normal postpartum physcian exam. Patient has a [place] Score: 17. Patient does not have suicidal ideology and will not harm the baby. Slight Depression. The patient is socio- ecomonically compromised. She was on unemployment benefit and with the new regularations her unemployement  Benefit has been cut. She is under threat of eviction at present. She has been trying to find work but having  difficulty. Have made contact with the [facility] to f/u in the community with the patient with a home visit. Patient reassured and advised of plan to help her in her situation. To f/u in 1 month.  Pap smear deferred as normal early pregnancy  Plan:    F/u in 1 month for evaluation of situation  Was agreeable to return in 4 weeks.  To return for Nexplanon  as birth control - to schedule.  Earl Gala, CNM. 10/12/2011 11:37 AM

## 2011-10-14 ENCOUNTER — Encounter: Payer: Self-pay | Admitting: Obstetrics and Gynecology

## 2011-11-15 ENCOUNTER — Encounter: Payer: Self-pay | Admitting: Obstetrics and Gynecology

## 2011-11-15 ENCOUNTER — Ambulatory Visit (INDEPENDENT_AMBULATORY_CARE_PROVIDER_SITE_OTHER): Payer: Medicaid Other | Admitting: Obstetrics and Gynecology

## 2011-11-15 ENCOUNTER — Encounter: Payer: Medicaid Other | Admitting: Obstetrics and Gynecology

## 2011-11-15 VITALS — BP 120/80 | HR 82 | Wt 189.0 lb

## 2011-11-15 DIAGNOSIS — IMO0001 Reserved for inherently not codable concepts without codable children: Secondary | ICD-10-CM

## 2011-11-15 DIAGNOSIS — Z309 Encounter for contraceptive management, unspecified: Secondary | ICD-10-CM

## 2011-11-15 LAB — POCT URINE PREGNANCY: Preg Test, Ur: NEGATIVE

## 2011-11-15 MED ORDER — NORETHINDRONE 0.35 MG PO TABS: 1.0000 | ORAL_TABLET | Freq: Every day | ORAL | Status: DC

## 2011-11-15 NOTE — Progress Notes (Signed)
30 YO lactating patient presents for  Nexplanon insertion but has had unprotected intercourse in the past week.  Wants to use POP in interim-Micronor as she is breastfeeding.  Advised that breastfeeding is not considered a reliable method of contraception so she should use barrier method (condoms) for first cycle of pills.   A: Contraception     Breastfeeding  P: Micronor #1 1 po qd with back up barrier contraception for the first cycle.11 refills       If she wants to use Nexplanon, she should continue Micronor or use condoms until it is inserted.        RTO-as scheduled or prn  Amarilys Lyles, PA-C

## 2011-12-20 ENCOUNTER — Ambulatory Visit (INDEPENDENT_AMBULATORY_CARE_PROVIDER_SITE_OTHER): Payer: Medicaid Other | Admitting: Obstetrics and Gynecology

## 2011-12-20 ENCOUNTER — Encounter: Payer: Self-pay | Admitting: Obstetrics and Gynecology

## 2011-12-20 VITALS — BP 110/72 | HR 78 | Wt 209.0 lb

## 2011-12-20 DIAGNOSIS — Z30017 Encounter for initial prescription of implantable subdermal contraceptive: Secondary | ICD-10-CM

## 2011-12-20 MED ORDER — ETONOGESTREL 68 MG ~~LOC~~ IMPL
68.0000 mg | DRUG_IMPLANT | Freq: Once | SUBCUTANEOUS | Status: AC
  Administered 2011-12-20: 68 mg via SUBCUTANEOUS

## 2011-12-20 NOTE — Addendum Note (Signed)
Addended byWinfred Leeds on: 12/20/2011 05:21 PM   Modules accepted: Orders

## 2011-12-20 NOTE — Progress Notes (Signed)
30 YO for Nexplanon insertion, currently on Micronor for contraception.  O: Nexplanon inserted in medial left upper arm per protocol without difficulty. Palpated by clinician.  Dressed with sterile band-aid , 4 x 4 and Kling pressure      dressing.      UPT-negative  A: Nexplanon Insertion  P;  REviewed signs/symptoms of infection and wound care       RTO-1 week for follow up      Nexplanon post insertion instrucitions given  Jamiracle Avants, PA-C

## 2011-12-20 NOTE — Patient Instructions (Addendum)
Call [facility] [phone]:  -for temperature of 100.4 degrees Fahrenheit or more -pain not improved with over the counter pain medications (Ibuprofen, Advil, Aleve,     Tylenol or acetaminophen) -for excessive bleeding from insertion site -for excessive swelling redness or green drainage from your insertion site -for any other concerns -keep insertion site clean, dry and covered  for 24 hours -you may remove pressure bandage in 1-4 hours  Use a back-up method of birth control for the next 4 weeks  

## 2011-12-30 ENCOUNTER — Encounter: Payer: Medicaid Other | Admitting: Obstetrics and Gynecology

## 2013-04-30 ENCOUNTER — Emergency Department (HOSPITAL_COMMUNITY): Payer: BC Managed Care – PPO

## 2013-04-30 ENCOUNTER — Encounter (HOSPITAL_COMMUNITY): Payer: Self-pay | Admitting: Emergency Medicine

## 2013-04-30 ENCOUNTER — Emergency Department (HOSPITAL_COMMUNITY)
Admission: EM | Admit: 2013-04-30 | Discharge: 2013-04-30 | Disposition: A | Discharge status: Home / Self Care | Payer: BC Managed Care – PPO | Attending: Emergency Medicine | Admitting: Emergency Medicine

## 2013-04-30 DIAGNOSIS — R51 Headache: Secondary | ICD-10-CM

## 2013-04-30 DIAGNOSIS — Z8719 Personal history of other diseases of the digestive system: Secondary | ICD-10-CM | POA: Insufficient documentation

## 2013-04-30 DIAGNOSIS — S0990XA Unspecified injury of head, initial encounter: Secondary | ICD-10-CM | POA: Insufficient documentation

## 2013-04-30 DIAGNOSIS — R42 Dizziness and giddiness: Secondary | ICD-10-CM | POA: Insufficient documentation

## 2013-04-30 DIAGNOSIS — S199XXA Unspecified injury of neck, initial encounter: Secondary | ICD-10-CM

## 2013-04-30 DIAGNOSIS — E663 Overweight: Secondary | ICD-10-CM | POA: Insufficient documentation

## 2013-04-30 DIAGNOSIS — Y92410 Unspecified street and highway as the place of occurrence of the external cause: Secondary | ICD-10-CM | POA: Insufficient documentation

## 2013-04-30 DIAGNOSIS — Y9389 Activity, other specified: Secondary | ICD-10-CM | POA: Insufficient documentation

## 2013-04-30 DIAGNOSIS — S6990XA Unspecified injury of unspecified wrist, hand and finger(s), initial encounter: Secondary | ICD-10-CM | POA: Insufficient documentation

## 2013-04-30 DIAGNOSIS — R519 Headache, unspecified: Secondary | ICD-10-CM

## 2013-04-30 DIAGNOSIS — Z8742 Personal history of other diseases of the female genital tract: Secondary | ICD-10-CM | POA: Insufficient documentation

## 2013-04-30 DIAGNOSIS — S0993XA Unspecified injury of face, initial encounter: Secondary | ICD-10-CM | POA: Insufficient documentation

## 2013-04-30 MED ORDER — ACETAMINOPHEN 325 MG PO TABS
650.0000 mg | ORAL_TABLET | Freq: Once | ORAL | Status: AC
  Administered 2013-04-30: 650 mg via ORAL
  Filled 2013-04-30: qty 2

## 2013-04-30 MED ORDER — AMOXICILLIN 500 MG PO CAPS
500.0000 mg | ORAL_CAPSULE | Freq: Three times a day (TID) | ORAL | Status: DC
Start: 2013-04-30 — End: 2013-12-23

## 2013-04-30 NOTE — Discharge Instructions (Signed)
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
## 2013-04-30 NOTE — ED Notes (Signed)
MD at bedside. 

## 2013-04-30 NOTE — ED Notes (Signed)
Patient transported to X-ray 

## 2013-04-30 NOTE — ED Notes (Signed)
Patient transported to CT 

## 2013-04-30 NOTE — ED Notes (Signed)
PT ambulated with baseline gait; VSS; A&Ox3; no signs of distress; respirations even and unlabored; skin warm and dry; no questions upon discharge.  

## 2013-05-05 NOTE — ED Provider Notes (Signed)
Medical screening examination/treatment/procedure(s) were performed by non-physician practitioner and as supervising physician I was immediately available for consultation/collaboration.  EKG Interpretation   None         Nafeesa Dils, MD 05/05/13 0713 

## 2013-12-22 ENCOUNTER — Encounter (HOSPITAL_COMMUNITY): Payer: Self-pay

## 2013-12-22 ENCOUNTER — Inpatient Hospital Stay (HOSPITAL_COMMUNITY): Payer: Medicaid Other

## 2013-12-22 ENCOUNTER — Inpatient Hospital Stay (HOSPITAL_COMMUNITY)
Admission: AD | Admit: 2013-12-22 | Discharge: 2013-12-23 | Disposition: A | Discharge status: Home / Self Care | Payer: Self-pay | Source: Ambulatory Visit | Attending: Obstetrics and Gynecology | Admitting: Obstetrics and Gynecology

## 2013-12-22 DIAGNOSIS — O9989 Other specified diseases and conditions complicating pregnancy, childbirth and the puerperium: Secondary | ICD-10-CM | POA: Insufficient documentation

## 2013-12-22 DIAGNOSIS — O26891 Other specified pregnancy related conditions, first trimester: Secondary | ICD-10-CM

## 2013-12-22 DIAGNOSIS — N949 Unspecified condition associated with female genital organs and menstrual cycle: Secondary | ICD-10-CM | POA: Insufficient documentation

## 2013-12-22 DIAGNOSIS — R102 Pelvic and perineal pain: Secondary | ICD-10-CM

## 2013-12-22 LAB — URINALYSIS, ROUTINE W REFLEX MICROSCOPIC
Bilirubin Urine: NEGATIVE
GLUCOSE, UA: NEGATIVE mg/dL
HGB URINE DIPSTICK: NEGATIVE
Ketones, ur: NEGATIVE mg/dL
Nitrite: NEGATIVE
PROTEIN: NEGATIVE mg/dL
Specific Gravity, Urine: 1.02 (ref 1.005–1.030)
Urobilinogen, UA: 0.2 mg/dL (ref 0.0–1.0)
pH: 6 (ref 5.0–8.0)

## 2013-12-22 LAB — HCG, QUANTITATIVE, PREGNANCY: HCG, BETA CHAIN, QUANT, S: 38366 m[IU]/mL — AB (ref <5)

## 2013-12-22 LAB — CBC
HCT: 37 % (ref 36.0–46.0)
Hemoglobin: 12.6 g/dL (ref 12.0–15.0)
MCH: 29.4 pg (ref 26.0–34.0)
MCHC: 34.1 g/dL (ref 30.0–36.0)
MCV: 86.4 fL (ref 78.0–100.0)
PLATELETS: 220 10*3/uL (ref 150–400)
RBC: 4.28 MIL/uL (ref 3.87–5.11)
RDW: 13.3 % (ref 11.5–15.5)
WBC: 7.5 10*3/uL (ref 4.0–10.5)

## 2013-12-22 LAB — URINE MICROSCOPIC-ADD ON

## 2013-12-22 LAB — POCT PREGNANCY, URINE: PREG TEST UR: POSITIVE — AB

## 2013-12-22 NOTE — MAU Note (Signed)
LMP in July. Pelvic pain since yesterday. Denies vaginal bleeding. Vaginal discharge; clear since this morning. Positive pregnancy test at home on Tuesday. LMP 10/13/13

## 2013-12-23 ENCOUNTER — Encounter (HOSPITAL_COMMUNITY): Payer: Self-pay | Admitting: *Deleted

## 2013-12-23 DIAGNOSIS — O26891 Other specified pregnancy related conditions, first trimester: Secondary | ICD-10-CM

## 2013-12-23 DIAGNOSIS — Z3A01 Less than 8 weeks gestation of pregnancy: Secondary | ICD-10-CM

## 2013-12-23 DIAGNOSIS — R102 Pelvic and perineal pain: Secondary | ICD-10-CM

## 2013-12-23 LAB — WET PREP, GENITAL
TRICH WET PREP: NONE SEEN
YEAST WET PREP: NONE SEEN

## 2013-12-23 LAB — HIV ANTIBODY (ROUTINE TESTING W REFLEX): HIV 1&2 Ab, 4th Generation: NONREACTIVE

## 2013-12-23 NOTE — Discharge Instructions (Signed)
First Trimester of Pregnancy The first trimester of pregnancy is from week 1 until the end of week 12 (months 1 through 3). A week after a sperm fertilizes an egg, the egg will implant on the wall of the uterus. This embryo will begin to develop into a baby. Genes from you and your partner are forming the baby. The female genes determine whether the baby is a boy or a girl. At 6-8 weeks, the eyes and face are formed, and the heartbeat can be seen on ultrasound. At the end of 12 weeks, all the baby's organs are formed.  Now that you are pregnant, you will want to do everything you can to have a healthy baby. Two of the most important things are to get good prenatal care and to follow your health care provider's instructions. Prenatal care is all the medical care you receive before the baby's birth. This care will help prevent, find, and treat any problems during the pregnancy and childbirth. BODY CHANGES Your body goes through many changes during pregnancy. The changes vary from woman to woman.   You may gain or lose a couple of pounds at first.  You may feel sick to your stomach (nauseous) and throw up (vomit). If the vomiting is uncontrollable, call your health care provider.  You may tire easily.  You may develop headaches that can be relieved by medicines approved by your health care provider.  You may urinate more often. Painful urination may mean you have a bladder infection.  You may develop heartburn as a result of your pregnancy.  You may develop constipation because certain hormones are causing the muscles that push waste through your intestines to slow down.  You may develop hemorrhoids or swollen, bulging veins (varicose veins).  Your breasts may begin to grow larger and become tender. Your nipples may stick out more, and the tissue that surrounds them (areola) may become darker.  Your gums may bleed and may be sensitive to brushing and flossing.  Dark spots or blotches (chloasma,  mask of pregnancy) may develop on your face. This will likely fade after the baby is born.  Your menstrual periods will stop.  You may have a loss of appetite.  You may develop cravings for certain kinds of food.  You may have changes in your emotions from day to day, such as being excited to be pregnant or being concerned that something may go wrong with the pregnancy and baby.  You may have more vivid and strange dreams.  You may have changes in your hair. These can include thickening of your hair, rapid growth, and changes in texture. Some women also have hair loss during or after pregnancy, or hair that feels dry or thin. Your hair will most likely return to normal after your baby is born. WHAT TO EXPECT AT YOUR PRENATAL VISITS During a routine prenatal visit:  You will be weighed to make sure you and the baby are growing normally.  Your blood pressure will be taken.  Your abdomen will be measured to track your baby's growth.  The fetal heartbeat will be listened to starting around week 10 or 12 of your pregnancy.  Test results from any previous visits will be discussed. Your health care provider may ask you:  How you are feeling.  If you are feeling the baby move.  If you have had any abnormal symptoms, such as leaking fluid, bleeding, severe headaches, or abdominal cramping.  If you have any questions. Other tests   that may be performed during your first trimester include:  Blood tests to find your blood type and to check for the presence of any previous infections. They will also be used to check for low iron levels (anemia) and Rh antibodies. Later in the pregnancy, blood tests for diabetes will be done along with other tests if problems develop.  Urine tests to check for infections, diabetes, or protein in the urine.  An ultrasound to confirm the proper growth and development of the baby.  An amniocentesis to check for possible genetic problems.  Fetal screens for  spina bifida and Down syndrome.  You may need other tests to make sure you and the baby are doing well. HOME CARE INSTRUCTIONS  Medicines  Follow your health care provider's instructions regarding medicine use. Specific medicines may be either safe or unsafe to take during pregnancy.  Take your prenatal vitamins as directed.  If you develop constipation, try taking a stool softener if your health care provider approves. Diet  Eat regular, well-balanced meals. Choose a variety of foods, such as meat or vegetable-based protein, fish, milk and low-fat dairy products, vegetables, fruits, and whole grain breads and cereals. Your health care provider will help you determine the amount of weight gain that is right for you.  Avoid raw meat and uncooked cheese. These carry germs that can cause birth defects in the baby.  Eating four or five small meals rather than three large meals a day may help relieve nausea and vomiting. If you start to feel nauseous, eating a few soda crackers can be helpful. Drinking liquids between meals instead of during meals also seems to help nausea and vomiting.  If you develop constipation, eat more high-fiber foods, such as fresh vegetables or fruit and whole grains. Drink enough fluids to keep your urine clear or pale yellow. Activity and Exercise  Exercise only as directed by your health care provider. Exercising will help you:  Control your weight.  Stay in shape.  Be prepared for labor and delivery.  Experiencing pain or cramping in the lower abdomen or low back is a good sign that you should stop exercising. Check with your health care provider before continuing normal exercises.  Try to avoid standing for long periods of time. Move your legs often if you must stand in one place for a long time.  Avoid heavy lifting.  Wear low-heeled shoes, and practice good posture.  You may continue to have sex unless your health care provider directs you  otherwise. Relief of Pain or Discomfort  Wear a good support bra for breast tenderness.   Take warm sitz baths to soothe any pain or discomfort caused by hemorrhoids. Use hemorrhoid cream if your health care provider approves.   Rest with your legs elevated if you have leg cramps or low back pain.  If you develop varicose veins in your legs, wear support hose. Elevate your feet for 15 minutes, 3-4 times a day. Limit salt in your diet. Prenatal Care  Schedule your prenatal visits by the twelfth week of pregnancy. They are usually scheduled monthly at first, then more often in the last 2 months before delivery.  Write down your questions. Take them to your prenatal visits.  Keep all your prenatal visits as directed by your health care provider. Safety  Wear your seat belt at all times when driving.  Make a list of emergency phone numbers, including numbers for family, friends, the hospital, and police and fire departments. General Tips    Ask your health care provider for a referral to a local prenatal education class. Begin classes no later than at the beginning of month 6 of your pregnancy.  Ask for help if you have counseling or nutritional needs during pregnancy. Your health care provider can offer advice or refer you to specialists for help with various needs.  Do not use hot tubs, steam rooms, or saunas.  Do not douche or use tampons or scented sanitary pads.  Do not cross your legs for long periods of time.  Avoid cat litter boxes and soil used by cats. These carry germs that can cause birth defects in the baby and possibly loss of the fetus by miscarriage or stillbirth.  Avoid all smoking, herbs, alcohol, and medicines not prescribed by your health care provider. Chemicals in these affect the formation and growth of the baby.  Schedule a dentist appointment. At home, brush your teeth with a soft toothbrush and be gentle when you floss. SEEK MEDICAL CARE IF:   You have  dizziness.  You have mild pelvic cramps, pelvic pressure, or nagging pain in the abdominal area.  You have persistent nausea, vomiting, or diarrhea.  You have a bad smelling vaginal discharge.  You have pain with urination.  You notice increased swelling in your face, hands, legs, or ankles. SEEK IMMEDIATE MEDICAL CARE IF:   You have a fever.  You are leaking fluid from your vagina.  You have spotting or bleeding from your vagina.  You have severe abdominal cramping or pain.  You have rapid weight gain or loss.  You vomit blood or material that looks like coffee grounds.  You are exposed to German measles and have never had them.  You are exposed to fifth disease or chickenpox.  You develop a severe headache.  You have shortness of breath.  You have any kind of trauma, such as from a fall or a car accident. Document Released: 03/01/2001 Document Revised: 07/22/2013 Document Reviewed: 01/15/2013 [employer] Patient Information 2015 [employer]. This information is not intended to replace advice given to you by your health care provider. Make sure you discuss any questions you have with your health care provider.  

## 2013-12-23 NOTE — MAU Provider Note (Signed)
History     CSN: [id number]  Arrival date and time: 12/22/13 2002   First Provider Initiated Contact with Patient 12/23/13 0003      Chief Complaint  Patient presents with  . Pelvic Pain  . Possible Pregnancy   Pelvic Pain The patient's primary symptoms include pelvic pain.  Possible Pregnancy    Rachael Wilson is a 33 y.o. G5P3013 at 6 weeks 3 day who presents today with cramping. She states that she has had cramping for a couple of days. She took a home pregnancy test on Tuesday, and it was positive. She denies any vaginal bleeding. She went to [facility] in the past for prenatal care. She is planning on going back there for prenatal care.   Past Medical History  Diagnosis Date  . Polycystic ovarian syndrome   . IBS (irritable bowel syndrome)   . Pregnancy induced hypertension     with G1  . Gestational diabetes     diet controlled  . Sleep apnea     h/o sleep apnea when overweight    Past Surgical History  Procedure Laterality Date  . Cesarean section      x 2  . Dilation and curettage of uterus    . Cesarean section  08/13/2011    Procedure: CESAREAN SECTION;  Surgeon: Purcell NailsAngela Y Roberts, MD;  Location: [facility];  Service: Gynecology;  Laterality: N/A;  Repeat cesarean section with delivery of baby girl at [id number].  Apgars 8/8/9.    Family History  Problem Relation Age of Onset  . Diabetes Mother   . Diabetes Maternal Grandmother   . Cancer Maternal Grandmother   . Diabetes Maternal Grandfather   . Diabetes Paternal Grandmother   . Diabetes Paternal Grandfather   . Anesthesia problems Neg Hx   . Hypotension Neg Hx   . Malignant hyperthermia Neg Hx   . Pseudochol deficiency Neg Hx     History  Substance Use Topics  . Smoking status: Never Smoker   . Smokeless tobacco: Never Used  . Alcohol Use: No     Comment: occ. drink    Allergies:  Allergies  Allergen Reactions  . Bactrim Hives and Other (See Comments)    Patient states that she gets blisters from this  medication.  . Sulfa Antibiotics Hives and Other (See Comments)    Patient states that she gets blisters from this medication.    Prescriptions prior to admission  Medication Sig Dispense Refill  . Prenatal Vit-Fe Fumarate-FA (PRENATAL MULTIVITAMIN) TABS tablet Take 1 tablet by mouth daily at 12 noon.      Marland Kitchen. amoxicillin (AMOXIL) 500 MG capsule Take 1 capsule (500 mg total) by mouth 3 (three) times daily.  21 capsule  0  . ferrous sulfate 325 (65 FE) MG tablet Take 1 tablet (325 mg total) by mouth 2 (two) times daily.  60 tablet  1  . norethindrone (MICRONOR,CAMILA,ERRIN) 0.35 MG tablet Take 1 tablet (0.35 mg total) by mouth daily.  1 Package  11    Review of Systems  Genitourinary: Positive for pelvic pain.   Physical Exam   Blood pressure 137/75, pulse 87, temperature 99.5 F (37.5 C), temperature source Oral, resp. rate 18, height 5\' 3"  (1.6 m), weight 93.441 kg (206 lb), last menstrual period 10/13/2013, SpO2 100.00%, not currently breastfeeding.  Physical Exam  Nursing note and vitals reviewed. Constitutional: She is oriented to person, place, and time. She appears well-developed and well-nourished. No distress.  Cardiovascular: Normal rate.  Respiratory: Effort normal.  GI: Soft. There is no tenderness. There is no rebound.  Genitourinary:   External: no lesion Vagina: small amount of white discharge Cervix: pink, smooth, no CMT Uterus: NSSC Adnexa: NT   Neurological: She is alert and oriented to person, place, and time.  Skin: Skin is warm and dry.  Psychiatric: She has a normal mood and affect.    MAU Course  Procedures  Results for orders placed during the hospital encounter of 12/22/13 (from the past 24 hour(s))  URINALYSIS, ROUTINE W REFLEX MICROSCOPIC     Status: Abnormal   Collection Time    12/22/13  8:34 PM      Result Value Ref Range   Color, Urine YELLOW  YELLOW   APPearance CLEAR  CLEAR   Specific Gravity, Urine 1.020  1.005 - 1.030   pH 6.0  5.0 -  8.0   Glucose, UA NEGATIVE  NEGATIVE mg/dL   Hgb urine dipstick NEGATIVE  NEGATIVE   Bilirubin Urine NEGATIVE  NEGATIVE   Ketones, ur NEGATIVE  NEGATIVE mg/dL   Protein, ur NEGATIVE  NEGATIVE mg/dL   Urobilinogen, UA 0.2  0.0 - 1.0 mg/dL   Nitrite NEGATIVE  NEGATIVE   Leukocytes, UA SMALL (*) NEGATIVE  URINE MICROSCOPIC-ADD ON     Status: Abnormal   Collection Time    12/22/13  8:34 PM      Result Value Ref Range   Squamous Epithelial / LPF MANY (*) RARE   WBC, UA 7-10  <3 WBC/hpf   RBC / HPF 3-6  <3 RBC/hpf   Bacteria, UA FEW (*) RARE  POCT PREGNANCY, URINE     Status: Abnormal   Collection Time    12/22/13  8:48 PM      Result Value Ref Range   Preg Test, Ur POSITIVE (*) NEGATIVE  CBC     Status: None   Collection Time    12/22/13  9:00 PM      Result Value Ref Range   WBC 7.5  4.0 - 10.5 K/uL   RBC 4.28  3.87 - 5.11 MIL/uL   Hemoglobin 12.6  12.0 - 15.0 g/dL   HCT 16.1  09.6 - 04.5 %   MCV 86.4  78.0 - 100.0 fL   MCH 29.4  26.0 - 34.0 pg   MCHC 34.1  30.0 - 36.0 g/dL   RDW 40.9  81.1 - 91.4 %   Platelets 220  150 - 400 K/uL  HCG, QUANTITATIVE, PREGNANCY     Status: Abnormal   Collection Time    12/22/13  9:00 PM      Result Value Ref Range   hCG, Beta Chain, Quant, S [id number] (*) <5 mIU/mL   US Ob Comp Less 14 Wks  12/22/2013   CLINICAL DATA:  Initial encounter for pelvic pain during first trimester pregnancy. G5 P3 SAB 1. Personal history of C-section x3.  EXAM: OBSTETRIC <14 WK ULTRASOUND  TECHNIQUE: Transabdominal ultrasound was performed for evaluation of the gestation as well as the maternal uterus and adnexal regions.  COMPARISON:  None for this pregnancy.  FINDINGS: Intrauterine gestational sac: A single gestational sac is somewhat ovoid.  Yolk sac:  Present  Embryo:  Present  Cardiac Activity: Present  Heart Rate: 118 bpm  MSD:   mm    w     d  CRL:   6.5  mm   6 w 3 d  [place] EDC: 08/14/2014  Maternal uterus/adnexae: No hemorrhage is present. Cervix is  closed. There is no free fluid. The adnexa are within normal limits.  IMPRESSION: 1. Single intrauterine gestational pregnancy without evidence for complication. 2. The estimated gestational age is 6 weeks 3 days.   Electronically Signed   By: Gennette Pac M.D.   On: 12/22/2013 23:41   US Ob Transvaginal  12/22/2013   CLINICAL DATA:  Initial encounter for pelvic pain during first trimester pregnancy. G5 P3 SAB 1. Personal history of C-section x3.  EXAM: OBSTETRIC <14 WK ULTRASOUND  TECHNIQUE: Transabdominal ultrasound was performed for evaluation of the gestation as well as the maternal uterus and adnexal regions.  COMPARISON:  None for this pregnancy.  FINDINGS: Intrauterine gestational sac: A single gestational sac is somewhat ovoid.  Yolk sac:  Present  Embryo:  Present  Cardiac Activity: Present  Heart Rate: 118 bpm  MSD:   mm    w     d  CRL:   6.5  mm   6 w 3 d                  [place] EDC: 08/14/2014  Maternal uterus/adnexae: No hemorrhage is present. Cervix is closed. There is no free fluid. The adnexa are within normal limits.  IMPRESSION: 1. Single intrauterine gestational pregnancy without evidence for complication. 2. The estimated gestational age is 6 weeks 3 days.   Electronically Signed   By: Gennette Pac M.D.   On: 12/22/2013 23:41     Assessment and Plan   1. Pelvic pain affecting pregnancy in first trimester, antepartum    First trimester precautions Return to MAU as needed Start [facility] as soon as possible  Follow-up Information   Schedule an appointment as soon as possible for a visit with [facility].   Specialty:  Obstetrics and Gynecology   Contact information:   [address] [place] [address] [phone]       Tawnya Crook 12/23/2013, 12:05 AM

## 2013-12-23 NOTE — MAU Provider Note (Signed)
Attestation of Attending Supervision of Advanced Practitioner (CNM/NP): Evaluation and management procedures were performed by the Advanced Practitioner under my supervision and collaboration.  I have reviewed the Advanced Practitioner's note and chart, and I agree with the management and plan.  Ginny Loomer 12/23/2013 2:58 AM   

## 2013-12-24 LAB — GC/CHLAMYDIA PROBE AMP
CT Probe RNA: NEGATIVE
GC Probe RNA: NEGATIVE

## 2014-01-20 ENCOUNTER — Encounter (HOSPITAL_COMMUNITY): Payer: Self-pay | Admitting: *Deleted

## 2014-02-19 ENCOUNTER — Encounter (HOSPITAL_COMMUNITY): Payer: Self-pay | Admitting: Emergency Medicine

## 2014-02-19 ENCOUNTER — Emergency Department (HOSPITAL_COMMUNITY)
Admission: EM | Admit: 2014-02-19 | Discharge: 2014-02-19 | Disposition: A | Discharge status: Home / Self Care | Payer: No Typology Code available for payment source | Attending: Emergency Medicine | Admitting: Emergency Medicine

## 2014-02-19 DIAGNOSIS — M545 Low back pain, unspecified: Secondary | ICD-10-CM

## 2014-02-19 DIAGNOSIS — Y92410 Unspecified street and highway as the place of occurrence of the external cause: Secondary | ICD-10-CM | POA: Insufficient documentation

## 2014-02-19 DIAGNOSIS — Z8639 Personal history of other endocrine, nutritional and metabolic disease: Secondary | ICD-10-CM | POA: Insufficient documentation

## 2014-02-19 DIAGNOSIS — G4730 Sleep apnea, unspecified: Secondary | ICD-10-CM | POA: Insufficient documentation

## 2014-02-19 DIAGNOSIS — Z8719 Personal history of other diseases of the digestive system: Secondary | ICD-10-CM | POA: Insufficient documentation

## 2014-02-19 DIAGNOSIS — Y9389 Activity, other specified: Secondary | ICD-10-CM | POA: Diagnosis not present

## 2014-02-19 DIAGNOSIS — Z79899 Other long term (current) drug therapy: Secondary | ICD-10-CM | POA: Diagnosis not present

## 2014-02-19 DIAGNOSIS — Z8632 Personal history of gestational diabetes: Secondary | ICD-10-CM | POA: Diagnosis not present

## 2014-02-19 DIAGNOSIS — S3992XA Unspecified injury of lower back, initial encounter: Secondary | ICD-10-CM | POA: Diagnosis present

## 2014-02-19 DIAGNOSIS — Y998 Other external cause status: Secondary | ICD-10-CM | POA: Insufficient documentation

## 2014-02-19 MED ORDER — IBUPROFEN 800 MG PO TABS: 800.0000 mg | ORAL_TABLET | Freq: Three times a day (TID) | ORAL | Status: DC
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
## 2014-02-27 NOTE — ED Notes (Signed)
Chart opened to print work note

## 2014-05-09 ENCOUNTER — Inpatient Hospital Stay (HOSPITAL_COMMUNITY)
Admission: AD | Admit: 2014-05-09 | Discharge: 2014-05-09 | Disposition: A | Discharge status: Home / Self Care | Payer: Medicaid Other | Source: Ambulatory Visit | Attending: Obstetrics & Gynecology | Admitting: Obstetrics & Gynecology

## 2014-05-09 ENCOUNTER — Encounter (HOSPITAL_COMMUNITY): Payer: Self-pay | Admitting: *Deleted

## 2014-05-09 DIAGNOSIS — W010XXA Fall on same level from slipping, tripping and stumbling without subsequent striking against object, initial encounter: Secondary | ICD-10-CM | POA: Insufficient documentation

## 2014-05-09 DIAGNOSIS — S4991XA Unspecified injury of right shoulder and upper arm, initial encounter: Secondary | ICD-10-CM

## 2014-05-09 DIAGNOSIS — Z3A09 9 weeks gestation of pregnancy: Secondary | ICD-10-CM | POA: Insufficient documentation

## 2014-05-09 DIAGNOSIS — W1830XA Fall on same level, unspecified, initial encounter: Secondary | ICD-10-CM

## 2014-05-09 DIAGNOSIS — Y92009 Unspecified place in unspecified non-institutional (private) residence as the place of occurrence of the external cause: Secondary | ICD-10-CM | POA: Insufficient documentation

## 2014-05-09 DIAGNOSIS — O210 Mild hyperemesis gravidarum: Secondary | ICD-10-CM | POA: Diagnosis not present

## 2014-05-09 DIAGNOSIS — S0990XA Unspecified injury of head, initial encounter: Secondary | ICD-10-CM | POA: Diagnosis not present

## 2014-05-09 DIAGNOSIS — Z3490 Encounter for supervision of normal pregnancy, unspecified, unspecified trimester: Secondary | ICD-10-CM

## 2014-05-09 DIAGNOSIS — W19XXXA Unspecified fall, initial encounter: Secondary | ICD-10-CM

## 2014-05-09 DIAGNOSIS — O219 Vomiting of pregnancy, unspecified: Secondary | ICD-10-CM

## 2014-05-09 DIAGNOSIS — O9989 Other specified diseases and conditions complicating pregnancy, childbirth and the puerperium: Secondary | ICD-10-CM | POA: Diagnosis not present

## 2014-05-09 DIAGNOSIS — O9A211 Injury, poisoning and certain other consequences of external causes complicating pregnancy, first trimester: Secondary | ICD-10-CM | POA: Diagnosis not present

## 2014-05-09 LAB — URINALYSIS, ROUTINE W REFLEX MICROSCOPIC
Bilirubin Urine: NEGATIVE
Glucose, UA: NEGATIVE mg/dL
Hgb urine dipstick: NEGATIVE
Ketones, ur: NEGATIVE mg/dL
Leukocytes, UA: NEGATIVE
Nitrite: NEGATIVE
PH: 6 (ref 5.0–8.0)
Protein, ur: NEGATIVE mg/dL
Urobilinogen, UA: 0.2 mg/dL (ref 0.0–1.0)

## 2014-05-09 LAB — POCT PREGNANCY, URINE: PREG TEST UR: POSITIVE — AB

## 2014-05-09 MED ORDER — FERROUS SULFATE 325 (65 FE) MG PO TABS: 325.0000 mg | ORAL_TABLET | Freq: Two times a day (BID) | ORAL | Status: DC

## 2014-05-09 MED ORDER — METOCLOPRAMIDE HCL 10 MG PO TABS: 10.0000 mg | ORAL_TABLET | Freq: Four times a day (QID) | ORAL | Status: DC

## 2014-05-09 MED ORDER — CYCLOBENZAPRINE HCL 10 MG PO TABS: 10.0000 mg | ORAL_TABLET | Freq: Two times a day (BID) | ORAL | Status: DC | PRN

## 2014-05-09 NOTE — MAU Note (Signed)
Slipped and fell on hardwood floors, hit rt side of body. Bump on rt side of forehead, bruise on rt forearm and upper arm.  Happened yesterday, took some iburprofen.  Didn't come in yesterday, waiting on medicaid

## 2014-05-09 NOTE — MAU Provider Note (Signed)
History     CSN: [id number]  Arrival date and time: 05/09/14 1807   First Provider Initiated Contact with Patient 05/09/14 2204      Chief Complaint  Patient presents with  . Fall   HPI Comments: Alonza SmokerDarra M Downen 34 y.o. [id number] presents to MAU following a fall at home on hardwood floors yesterday. She hit her head and right arm. She has been taking ibuprofen for the discomfort. She will be going to [facility] and has a [facility]B visit on March 1st. She is also complaining of nausea. She is requesting Fe tablets  Fall      Past Medical History  Diagnosis Date  . Polycystic ovarian syndrome   . IBS (irritable bowel syndrome)   . Pregnancy induced hypertension     with G1  . Gestational diabetes     diet controlled  . Sleep apnea     h/o sleep apnea when overweight    Past Surgical History  Procedure Laterality Date  . Cesarean section      x 2  . Dilation and curettage of uterus    . Cesarean section  08/13/2011    Procedure: CESAREAN SECTION;  Surgeon: Purcell NailsAngela Y Roberts, MD;  Location: [facility];  Service: Gynecology;  Laterality: N/A;  Repeat cesarean section with delivery of baby girl at [id number].  Apgars 8/8/9.    Family History  Problem Relation Age of Onset  . Diabetes Mother   . Diabetes Maternal Grandmother   . Cancer Maternal Grandmother   . Diabetes Maternal Grandfather   . Diabetes Paternal Grandmother   . Diabetes Paternal Grandfather   . Anesthesia problems Neg Hx   . Hypotension Neg Hx   . Malignant hyperthermia Neg Hx   . Pseudochol deficiency Neg Hx     History  Substance Use Topics  . Smoking status: Never Smoker   . Smokeless tobacco: Never Used  . Alcohol Use: No     Comment: occ. drink    Allergies:  Allergies  Allergen Reactions  . Bactrim Hives and Other (See Comments)    Patient states that she gets blisters from this medication.  . Sulfa Antibiotics Hives and Other (See Comments)    Patient states that she gets blisters from this medication.     Prescriptions prior to admission  Medication Sig Dispense Refill Last Dose  . ibuprofen (ADVIL,MOTRIN) 800 MG tablet Take 1 tablet (800 mg total) by mouth 3 (three) times daily. 21 tablet 0 05/09/2014 at Unknown time  . Prenatal Vit-Fe Fumarate-FA (PRENATAL MULTIVITAMIN) TABS tablet Take 1 tablet by mouth daily at 12 noon.   05/09/2014 at Unknown time  . ferrous sulfate 325 (65 FE) MG tablet Take 1 tablet (325 mg total) by mouth 2 (two) times daily. (Patient not taking: Reported on 05/09/2014) 60 tablet 1 Taking  . norethindrone (MICRONOR,CAMILA,ERRIN) 0.35 MG tablet Take 1 tablet (0.35 mg total) by mouth daily. (Patient not taking: Reported on 05/09/2014) 1 Package 11 Taking    Review of Systems  Constitutional:       Aches and pains from fall/ some bruising  HENT: Negative.   Eyes: Negative.   Respiratory: Negative.   Cardiovascular: Negative.   Gastrointestinal: Negative.   Genitourinary: Negative.   Musculoskeletal:       Right arm discomfort  Neurological: Negative.   Psychiatric/Behavioral: Negative.    Physical Exam   Blood pressure 121/78, pulse 85, temperature 100.2 F (37.9 C), temperature source Oral, resp. rate 18, weight 95.255 kg (  210 lb), last menstrual period 03/03/2014, unknown if currently breastfeeding.  Physical Exam  Constitutional: She is oriented to person, place, and time. She appears well-developed and well-nourished. No distress.  HENT:  Bruise over right eye  Cardiovascular: Normal rate, regular rhythm and normal heart sounds.   Respiratory: Effort normal and breath sounds normal. No respiratory distress. She has no wheezes. She has no rales.  GI:  +FHT  Musculoskeletal:  Bruise on right elbow and upper arm  Neurological: She is alert and oriented to person, place, and time.  Skin: Skin is warm and dry.  Psychiatric: She has a normal mood and affect. Her behavior is normal. Judgment and thought content normal.    MAU Course   Procedures  MDM  Pt requested Rx for Fe and nausea medication  Assessment and Plan   A: Fall at home Pregnancy Nausea  P: Flexeril 10 mg po q8 hours prn Reglan 10 mg PO q8 hours prn nausea Fe So4 may purchase OTC Advised tylenol/ warm baths for discomforts Follow up with [facility]/ MAU as needed  Carolynn Serve 05/09/2014, 10:29 PM

## 2014-05-09 NOTE — Progress Notes (Signed)
Written and verbal d/c instructions given and understanding voiced. 

## 2014-05-09 NOTE — Discharge Instructions (Signed)
Morning Sickness Morning sickness is when you feel sick to your stomach (nauseous) during pregnancy. This nauseous feeling may or may not come with vomiting. It often occurs in the morning but can be a problem any time of day. Morning sickness is most common during the first trimester, but it may continue throughout pregnancy. While morning sickness is unpleasant, it is usually harmless unless you develop severe and continual vomiting (hyperemesis gravidarum). This condition requires more intense treatment.  CAUSES  The cause of morning sickness is not completely known but seems to be related to normal hormonal changes that occur in pregnancy. RISK FACTORS You are at greater risk if you:  Experienced nausea or vomiting before your pregnancy.  Had morning sickness during a previous pregnancy.  Are pregnant with more than one baby, such as twins. TREATMENT  Do not use any medicines (prescription, over-the-counter, or herbal) for morning sickness without first talking to your health care provider. Your health care provider may prescribe or recommend:  Vitamin B6 supplements.  Anti-nausea medicines.  The herbal medicine ginger. HOME CARE INSTRUCTIONS   Only take over-the-counter or prescription medicines as directed by your health care provider.  Taking multivitamins before getting pregnant can prevent or decrease the severity of morning sickness in most women.  Eat a piece of dry toast or unsalted crackers before getting out of bed in the morning.  Eat five or six small meals a day.  Eat dry and bland foods (rice, baked potato). Foods high in carbohydrates are often helpful.  Do not drink liquids with your meals. Drink liquids between meals.  Avoid greasy, fatty, and spicy foods.  Get someone to cook for you if the smell of any food causes nausea and vomiting.  If you feel nauseous after taking prenatal vitamins, take the vitamins at night or with a snack.  Snack on protein  foods (nuts, yogurt, cheese) between meals if you are hungry.  Eat unsweetened gelatins for desserts.  Wearing an acupressure wristband (worn for sea sickness) may be helpful.  Acupuncture may be helpful.  Do not smoke.  Get a humidifier to keep the air in your house free of odors.  Get plenty of fresh air. SEEK MEDICAL CARE IF:   Your home remedies are not working, and you need medicine.  You feel dizzy or lightheaded.  You are losing weight. SEEK IMMEDIATE MEDICAL CARE IF:   You have persistent and uncontrolled nausea and vomiting.  You pass out (faint). MAKE SURE YOU:  Understand these instructions.  Will watch your condition.  Will get help right away if you are not doing well or get worse. Document Released: 04/28/2006 Document Revised: 03/12/2013 Document Reviewed: 08/22/2012 [facility] Patient Information 2015 [place], [place]. This information is not intended to replace advice given to you by your health care provider. Make sure you discuss any questions you have with your health care provider. First Trimester of Pregnancy The first trimester of pregnancy is from week 1 until the end of week 12 (months 1 through 3). A week after a sperm fertilizes an egg, the egg will implant on the wall of the uterus. This embryo will begin to develop into a baby. Genes from you and your partner are forming the baby. The female genes determine whether the baby is a boy or a girl. At 6-8 weeks, the eyes and face are formed, and the heartbeat can be seen on ultrasound. At the end of 12 weeks, all the baby's organs are formed.  Now that  you are pregnant, you will want to do everything you can to have a healthy baby. Two of the most important things are to get good prenatal care and to follow your health care provider's instructions. Prenatal care is all the medical care you receive before the baby's birth. This care will help prevent, find, and treat any problems during the pregnancy and  childbirth. BODY CHANGES Your body goes through many changes during pregnancy. The changes vary from woman to woman.   You may gain or lose a couple of pounds at first.  You may feel sick to your stomach (nauseous) and throw up (vomit). If the vomiting is uncontrollable, call your health care provider.  You may tire easily.  You may develop headaches that can be relieved by medicines approved by your health care provider.  You may urinate more often. Painful urination may mean you have a bladder infection.  You may develop heartburn as a result of your pregnancy.  You may develop constipation because certain hormones are causing the muscles that push waste through your intestines to slow down.  You may develop hemorrhoids or swollen, bulging veins (varicose veins).  Your breasts may begin to grow larger and become tender. Your nipples may stick out more, and the tissue that surrounds them (areola) may become darker.  Your gums may bleed and may be sensitive to brushing and flossing.  Dark spots or blotches (chloasma, mask of pregnancy) may develop on your face. This will likely fade after the baby is born.  Your menstrual periods will stop.  You may have a loss of appetite.  You may develop cravings for certain kinds of food.  You may have changes in your emotions from day to day, such as being excited to be pregnant or being concerned that something may go wrong with the pregnancy and baby.  You may have more vivid and strange dreams.  You may have changes in your hair. These can include thickening of your hair, rapid growth, and changes in texture. Some women also have hair loss during or after pregnancy, or hair that feels dry or thin. Your hair will most likely return to normal after your baby is born. WHAT TO EXPECT AT YOUR PRENATAL VISITS During a routine prenatal visit:  You will be weighed to make sure you and the baby are growing normally.  Your blood pressure will  be taken.  Your abdomen will be measured to track your baby's growth.  The fetal heartbeat will be listened to starting around week 10 or 12 of your pregnancy.  Test results from any previous visits will be discussed. Your health care provider may ask you:  How you are feeling.  If you are feeling the baby move.  If you have had any abnormal symptoms, such as leaking fluid, bleeding, severe headaches, or abdominal cramping.  If you have any questions. Other tests that may be performed during your first trimester include:  Blood tests to find your blood type and to check for the presence of any previous infections. They will also be used to check for low iron levels (anemia) and Rh antibodies. Later in the pregnancy, blood tests for diabetes will be done along with other tests if problems develop.  Urine tests to check for infections, diabetes, or protein in the urine.  An ultrasound to confirm the proper growth and development of the baby.  An amniocentesis to check for possible genetic problems.  Fetal screens for spina bifida and Down syndrome.  You may need other tests to make sure you and the baby are doing well. HOME CARE INSTRUCTIONS  Medicines  Follow your health care provider's instructions regarding medicine use. Specific medicines may be either safe or unsafe to take during pregnancy.  Take your prenatal vitamins as directed.  If you develop constipation, try taking a stool softener if your health care provider approves. Diet  Eat regular, well-balanced meals. Choose a variety of foods, such as meat or vegetable-based protein, fish, milk and low-fat dairy products, vegetables, fruits, and whole grain breads and cereals. Your health care provider will help you determine the amount of weight gain that is right for you.  Avoid raw meat and uncooked cheese. These carry germs that can cause birth defects in the baby.  Eating four or five small meals rather than three  large meals a day may help relieve nausea and vomiting. If you start to feel nauseous, eating a few soda crackers can be helpful. Drinking liquids between meals instead of during meals also seems to help nausea and vomiting.  If you develop constipation, eat more high-fiber foods, such as fresh vegetables or fruit and whole grains. Drink enough fluids to keep your urine clear or pale yellow. Activity and Exercise  Exercise only as directed by your health care provider. Exercising will help you:  Control your weight.  Stay in shape.  Be prepared for labor and delivery.  Experiencing pain or cramping in the lower abdomen or low back is a good sign that you should stop exercising. Check with your health care provider before continuing normal exercises.  Try to avoid standing for long periods of time. Move your legs often if you must stand in one place for a long time.  Avoid heavy lifting.  Wear low-heeled shoes, and practice good posture.  You may continue to have sex unless your health care provider directs you otherwise. Relief of Pain or Discomfort  Wear a good support bra for breast tenderness.   Take warm sitz baths to soothe any pain or discomfort caused by hemorrhoids. Use hemorrhoid cream if your health care provider approves.   Rest with your legs elevated if you have leg cramps or low back pain.  If you develop varicose veins in your legs, wear support hose. Elevate your feet for 15 minutes, 3-4 times a day. Limit salt in your diet. Prenatal Care  Schedule your prenatal visits by the twelfth week of pregnancy. They are usually scheduled monthly at first, then more often in the last 2 months before delivery.  Write down your questions. Take them to your prenatal visits.  Keep all your prenatal visits as directed by your health care provider. Safety  Wear your seat belt at all times when driving.  Make a list of emergency phone numbers, including numbers for family,  friends, the hospital, and police and fire departments. General Tips  Ask your health care provider for a referral to a local prenatal education class. Begin classes no later than at the beginning of month 6 of your pregnancy.  Ask for help if you have counseling or nutritional needs during pregnancy. Your health care provider can offer advice or refer you to specialists for help with various needs.  Do not use hot tubs, steam rooms, or saunas.  Do not douche or use tampons or scented sanitary pads.  Do not cross your legs for long periods of time.  Avoid cat litter boxes and soil used by cats. These carry germs that  can cause birth defects in the baby and possibly loss of the fetus by miscarriage or stillbirth.  Avoid all smoking, herbs, alcohol, and medicines not prescribed by your health care provider. Chemicals in these affect the formation and growth of the baby.  Schedule a dentist appointment. At home, brush your teeth with a soft toothbrush and be gentle when you floss. SEEK MEDICAL CARE IF:   You have dizziness.  You have mild pelvic cramps, pelvic pressure, or nagging pain in the abdominal area.  You have persistent nausea, vomiting, or diarrhea.  You have a bad smelling vaginal discharge.  You have pain with urination.  You notice increased swelling in your face, hands, legs, or ankles. SEEK IMMEDIATE MEDICAL CARE IF:   You have a fever.  You are leaking fluid from your vagina.  You have spotting or bleeding from your vagina.  You have severe abdominal cramping or pain.  You have rapid weight gain or loss.  You vomit blood or material that looks like coffee grounds.  You are exposed to [place] measles and have never had them.  You are exposed to fifth disease or chickenpox.  You develop a severe headache.  You have shortness of breath.  You have any kind of trauma, such as from a fall or a car accident. Document Released: 03/01/2001 Document Revised:  07/22/2013 Document Reviewed: 01/15/2013 [facility] Patient Information 2015 [place]. This information is not intended to replace advice given to you by your health care provider. Make sure you discuss any questions you have with your health care provider. Bathing: Promoting Independence and Safety   Use long-handled brush or sponge to soap hard-to-reach areas. Hand-held shower directs water flow for rinsing off. Sit on bench with legs out of tub to towel dry.  Copyright  VHI. All rights reserved.

## 2014-05-20 ENCOUNTER — Encounter (HOSPITAL_COMMUNITY): Payer: Self-pay | Admitting: *Deleted

## 2014-05-20 ENCOUNTER — Inpatient Hospital Stay (HOSPITAL_COMMUNITY): Payer: Medicaid Other

## 2014-05-20 ENCOUNTER — Inpatient Hospital Stay (HOSPITAL_COMMUNITY)
Admission: AD | Admit: 2014-05-20 | Discharge: 2014-05-20 | Disposition: A | Discharge status: Home / Self Care | Payer: Medicaid Other | Source: Ambulatory Visit | Attending: Family Medicine | Admitting: Family Medicine

## 2014-05-20 DIAGNOSIS — O30001 Twin pregnancy, unspecified number of placenta and unspecified number of amniotic sacs, first trimester: Secondary | ICD-10-CM | POA: Insufficient documentation

## 2014-05-20 DIAGNOSIS — N760 Acute vaginitis: Secondary | ICD-10-CM | POA: Diagnosis not present

## 2014-05-20 DIAGNOSIS — O3421 Maternal care for scar from previous cesarean delivery: Secondary | ICD-10-CM | POA: Diagnosis not present

## 2014-05-20 DIAGNOSIS — N898 Other specified noninflammatory disorders of vagina: Secondary | ICD-10-CM | POA: Diagnosis present

## 2014-05-20 DIAGNOSIS — Z3A01 Less than 8 weeks gestation of pregnancy: Secondary | ICD-10-CM

## 2014-05-20 DIAGNOSIS — B9689 Other specified bacterial agents as the cause of diseases classified elsewhere: Secondary | ICD-10-CM | POA: Diagnosis not present

## 2014-05-20 DIAGNOSIS — O23591 Infection of other part of genital tract in pregnancy, first trimester: Secondary | ICD-10-CM | POA: Insufficient documentation

## 2014-05-20 DIAGNOSIS — Z3490 Encounter for supervision of normal pregnancy, unspecified, unspecified trimester: Secondary | ICD-10-CM

## 2014-05-20 DIAGNOSIS — Z3A11 11 weeks gestation of pregnancy: Secondary | ICD-10-CM | POA: Insufficient documentation

## 2014-05-20 DIAGNOSIS — Z3687 Encounter for antenatal screening for uncertain dates: Secondary | ICD-10-CM

## 2014-05-20 LAB — URINALYSIS, ROUTINE W REFLEX MICROSCOPIC
BILIRUBIN URINE: NEGATIVE
Glucose, UA: NEGATIVE mg/dL
Hgb urine dipstick: NEGATIVE
KETONES UR: 15 mg/dL — AB
Leukocytes, UA: NEGATIVE
Nitrite: NEGATIVE
Protein, ur: NEGATIVE mg/dL
Specific Gravity, Urine: 1.025 (ref 1.005–1.030)
UROBILINOGEN UA: 0.2 mg/dL (ref 0.0–1.0)
pH: 6.5 (ref 5.0–8.0)

## 2014-05-20 LAB — WET PREP, GENITAL
Trich, Wet Prep: NONE SEEN
Yeast Wet Prep HPF POC: NONE SEEN

## 2014-05-20 MED ORDER — METRONIDAZOLE 500 MG PO TABS: 500.0000 mg | ORAL_TABLET | Freq: Two times a day (BID) | ORAL | Status: DC

## 2014-05-20 NOTE — MAU Note (Signed)
Pt noted clear discharge this a.m., states it is watery.  Denies bleeding but is having lower abd pain.

## 2014-05-20 NOTE — MAU Note (Signed)
Pt back and u/s paged.

## 2014-05-20 NOTE — MAU Provider Note (Signed)
Chief Complaint: Vaginal Discharge   First Provider Initiated Contact with Patient 05/20/14 1534     SUBJECTIVE HPI: Rachael Wilson is a 34 y.o. [id number] at [redacted]w[redacted]d by LMP who presents with vaginal discharge that is mucusy and clear, non-irritative. Unsure LMP. Had pregnancy termination October 2015 and took OCPs for 1 month. Light period 03/03/14. Last intercourse December 2015. HPT positive this week.   Pregnancy course: NPC; awaiting MPW and plans care with [facility]. Previous C-section 3.  Past Medical History  Diagnosis Date  . Polycystic ovarian syndrome   . IBS (irritable bowel syndrome)   . Pregnancy induced hypertension     with G1  . Gestational diabetes     diet controlled  . Sleep apnea     h/o sleep apnea when overweight   OB History  Gravida Para Term Preterm AB SAB TAB Ectopic Multiple Living  0 1 1 0 0 0 3    # Outcome Date GA Lbr Len/2nd Weight Sex Delivery Anes PTL Lv  6 Current           5 Term 08/13/11 [redacted]w[redacted]d   F CS-LTranv Spinal  Y  4 Term 09/2009 [redacted]w[redacted]d  4.252 kg (9 lb 6 oz)  CS-LTranv Spinal       Comments: repeat c/section  3 SAB 06/2006 [redacted]w[redacted]d         2 Term 07/2004 [redacted]w[redacted]d  4.281 kg (9 lb 7 oz)  CS-LTranv EPI       Comments: failure to progress  1 [place]              Past Surgical History  Procedure Laterality Date  . Cesarean section      x 2  . Dilation and curettage of uterus    . Cesarean section  08/13/2011    Procedure: CESAREAN SECTION;  Surgeon: Purcell Nails, MD;  Location: [facility];  Service: Gynecology;  Laterality: N/A;  Repeat cesarean section with delivery of baby girl at 70.  Apgars 8/8/9.   History   Social History  . Marital Status: Single    Spouse Name: N/A  . Number of Children: N/A  . Years of Education: N/A   Occupational History  . Not on file.   Social History Main Topics  . Smoking status: Never Smoker   . Smokeless tobacco: Never Used  . Alcohol Use: No     Comment: occ. drink  . Drug Use: No  . Sexual  Activity: Yes    Birth Control/ Protection: None     Comment: pt is pregnant   Other Topics Concern  . Not on file   Social History Narrative   No current facility-administered medications on file prior to encounter.   Current Outpatient Prescriptions on File Prior to Encounter  Medication Sig Dispense Refill  . metoCLOPramide (REGLAN) 10 MG tablet Take 1 tablet (10 mg total) by mouth every 6 (six) hours. 30 tablet 0  . Prenatal Vit-Fe Fumarate-FA (PRENATAL MULTIVITAMIN) TABS tablet Take 1 tablet by mouth daily at 12 noon.    . cyclobenzaprine (FLEXERIL) 10 MG tablet Take 1 tablet (10 mg total) by mouth 2 (two) times daily as needed for muscle spasms. (Patient not taking: Reported on 05/20/2014) 30 tablet 0  . ferrous sulfate 325 (65 FE) MG tablet Take 1 tablet (325 mg total) by mouth 2 (two) times daily. (Patient not taking: Reported on 05/20/2014) 60 tablet 1   Allergies  Allergen Reactions  . Bactrim Hives and  Other (See Comments)    Patient states that she gets blisters from this medication.  . Sulfa Antibiotics Hives and Other (See Comments)    Patient states that she gets blisters from this medication.    ROS: Denies dysuria, urinary frequency or urgency.  OBJECTIVE Blood pressure 127/74, pulse 94, temperature 98.9 F (37.2 C), temperature source Oral, resp. rate 18, height 5\' 3"  (1.6 m), weight 96.786 kg (213 lb 6 oz), last menstrual period 03/03/2014, SpO2 100 %, unknown if currently breastfeeding. GENERAL: Obese female in no acute distress.  HEENT: Normocephalic HEART: normal rate RESP: normal effort ABDOMEN: Soft, non-tender; DT FHR 170 EXTREMITIES: Nontender, no edema NEURO: Alert and oriented SPECULUM EXAM: NEFG, physiologic discharge, no blood noted, cervix clean BIMANUAL: cervix L/C uterus 12-14 wk size, no adnexal tenderness or masses  LAB RESULTS Results for orders placed or performed during the hospital encounter of 05/20/14 (from the past 24 hour(s))   Urinalysis, Routine w reflex microscopic     Status: Abnormal   Collection Time: 05/20/14 12:49 PM  Result Value Ref Range   Color, Urine YELLOW YELLOW   APPearance CLEAR CLEAR   Specific Gravity, Urine 1.025 1.005 - 1.030   pH 6.5 5.0 - 8.0   Glucose, UA NEGATIVE NEGATIVE mg/dL   Hgb urine dipstick NEGATIVE NEGATIVE   Bilirubin Urine NEGATIVE NEGATIVE   Ketones, ur 15 (A) NEGATIVE mg/dL   Protein, ur NEGATIVE NEGATIVE mg/dL   Urobilinogen, UA 0.2 0.0 - 1.0 mg/dL   Nitrite NEGATIVE NEGATIVE   Leukocytes, UA NEGATIVE NEGATIVE  Wet prep, genital     Status: Abnormal   Collection Time: 05/20/14  3:40 PM  Result Value Ref Range   Yeast Wet Prep HPF POC NONE SEEN NONE SEEN   Trich, Wet Prep NONE SEEN NONE SEEN   Clue Cells Wet Prep HPF POC FEW (A) NONE SEEN   WBC, Wet Prep HPF POC FEW (A) NONE SEEN    IMAGING    MAU COURSE   ASSESSMENT 1. BV (bacterial vaginosis)   2. Pregnancy with uncertain dates, unspecified trimester   3. Unsure of LMP (last menstrual period) as reason for ultrasound scan   4. Twin gestation in first trimester     PLAN Discharge home with pregnancy precautions     Medication List    STOP taking these medications        cyclobenzaprine 10 MG tablet  Commonly known as:  FLEXERIL     ferrous sulfate 325 (65 FE) MG tablet      TAKE these medications        metoCLOPramide 10 MG tablet  Commonly known as:  REGLAN  Take 1 tablet (10 mg total) by mouth every 6 (six) hours.     metroNIDAZOLE 500 MG tablet  Commonly known as:  FLAGYL  Take 1 tablet (500 mg total) by mouth 2 (two) times daily.     prenatal multivitamin Tabs tablet  Take 1 tablet by mouth daily at 12 noon.       Follow-up Information    [facility].   Specialty:  Obstetrics and Gynecology   Why:  Someone from Ultrasound will call you with appt.   Contact information:   [address] [place]  [id number] [phone]      [facility].   Specialty:  Obstetrics and Gynecology   Contact information:   [address] [place]  [place] [address] [phone]      Danae Orleans, CNM 05/20/2014  3:43 PM

## 2014-05-20 NOTE — Discharge Instructions (Signed)
First Trimester of Pregnancy °The first trimester of pregnancy is from week 1 until the end of week 12 (months 1 through 3). A week after a sperm fertilizes an egg, the egg will implant on the wall of the uterus. This embryo will begin to develop into a baby. Genes from you and your partner are forming the baby. The female genes determine whether the baby is a boy or a girl. At 6-8 weeks, the eyes and face are formed, and the heartbeat can be seen on ultrasound. At the end of 12 weeks, all the baby's organs are formed.  °Now that you are pregnant, you will want to do everything you can to have a healthy baby. Two of the most important things are to get good prenatal care and to follow your health care provider's instructions. Prenatal care is all the medical care you receive before the baby's birth. This care will help prevent, find, and treat any problems during the pregnancy and childbirth. °BODY CHANGES °Your body goes through many changes during pregnancy. The changes vary from woman to woman.  °· You may gain or lose a couple of pounds at first. °· You may feel sick to your stomach (nauseous) and throw up (vomit). If the vomiting is uncontrollable, call your health care provider. °· You may tire easily. °· You may develop headaches that can be relieved by medicines approved by your health care provider. °· You may urinate more often. Painful urination may mean you have a bladder infection. °· You may develop heartburn as a result of your pregnancy. °· You may develop constipation because certain hormones are causing the muscles that push waste through your intestines to slow down. °· You may develop hemorrhoids or swollen, bulging veins (varicose veins). °· Your breasts may begin to grow larger and become tender. Your nipples may stick out more, and the tissue that surrounds them (areola) may become darker. °· Your gums may bleed and may be sensitive to brushing and flossing. °· Dark spots or blotches (chloasma,  mask of pregnancy) may develop on your face. This will likely fade after the baby is born. °· Your menstrual periods will stop. °· You may have a loss of appetite. °· You may develop cravings for certain kinds of food. °· You may have changes in your emotions from day to day, such as being excited to be pregnant or being concerned that something may go wrong with the pregnancy and baby. °· You may have more vivid and strange dreams. °· You may have changes in your hair. These can include thickening of your hair, rapid growth, and changes in texture. Some women also have hair loss during or after pregnancy, or hair that feels dry or thin. Your hair will most likely return to normal after your baby is born. °WHAT TO EXPECT AT YOUR PRENATAL VISITS °During a routine prenatal visit: °· You will be weighed to make sure you and the baby are growing normally. °· Your blood pressure will be taken. °· Your abdomen will be measured to track your baby's growth. °· The fetal heartbeat will be listened to starting around week 10 or 12 of your pregnancy. °· Test results from any previous visits will be discussed. °Your health care provider may ask you: °· How you are feeling. °· If you are feeling the baby move. °· If you have had any abnormal symptoms, such as leaking fluid, bleeding, severe headaches, or abdominal cramping. °· If you have any questions. °Other tests   that may be performed during your first trimester include: °· Blood tests to find your blood type and to check for the presence of any previous infections. They will also be used to check for low iron levels (anemia) and Rh antibodies. Later in the pregnancy, blood tests for diabetes will be done along with other tests if problems develop. °· Urine tests to check for infections, diabetes, or protein in the urine. °· An ultrasound to confirm the proper growth and development of the baby. °· An amniocentesis to check for possible genetic problems. °· Fetal screens for  spina bifida and Down syndrome. °· You may need other tests to make sure you and the baby are doing well. °HOME CARE INSTRUCTIONS  °Medicines °· Follow your health care provider's instructions regarding medicine use. Specific medicines may be either safe or unsafe to take during pregnancy. °· Take your prenatal vitamins as directed. °· If you develop constipation, try taking a stool softener if your health care provider approves. °Diet °· Eat regular, well-balanced meals. Choose a variety of foods, such as meat or vegetable-based protein, fish, milk and low-fat dairy products, vegetables, fruits, and whole grain breads and cereals. Your health care provider will help you determine the amount of weight gain that is right for you. °· Avoid raw meat and uncooked cheese. These carry germs that can cause birth defects in the baby. °· Eating four or five small meals rather than three large meals a day may help relieve nausea and vomiting. If you start to feel nauseous, eating a few soda crackers can be helpful. Drinking liquids between meals instead of during meals also seems to help nausea and vomiting. °· If you develop constipation, eat more high-fiber foods, such as fresh vegetables or fruit and whole grains. Drink enough fluids to keep your urine clear or pale yellow. °Activity and Exercise °· Exercise only as directed by your health care provider. Exercising will help you: °¨ Control your weight. °¨ Stay in shape. °¨ Be prepared for labor and delivery. °· Experiencing pain or cramping in the lower abdomen or low back is a good sign that you should stop exercising. Check with your health care provider before continuing normal exercises. °· Try to avoid standing for long periods of time. Move your legs often if you must stand in one place for a long time. °· Avoid heavy lifting. °· Wear low-heeled shoes, and practice good posture. °· You may continue to have sex unless your health care provider directs you  otherwise. °Relief of Pain or Discomfort °· Wear a good support bra for breast tenderness.   °· Take warm sitz baths to soothe any pain or discomfort caused by hemorrhoids. Use hemorrhoid cream if your health care provider approves.   °· Rest with your legs elevated if you have leg cramps or low back pain. °· If you develop varicose veins in your legs, wear support hose. Elevate your feet for 15 minutes, 3-4 times a day. Limit salt in your diet. °Prenatal Care °· Schedule your prenatal visits by the twelfth week of pregnancy. They are usually scheduled monthly at first, then more often in the last 2 months before delivery. °· Write down your questions. Take them to your prenatal visits. °· Keep all your prenatal visits as directed by your health care provider. °Safety °· Wear your seat belt at all times when driving. °· Make a list of emergency phone numbers, including numbers for family, friends, the hospital, and police and fire departments. °General Tips °·   Ask your health care provider for a referral to a local prenatal education class. Begin classes no later than at the beginning of month 6 of your pregnancy. °· Ask for help if you have counseling or nutritional needs during pregnancy. Your health care provider can offer advice or refer you to specialists for help with various needs. °· Do not use hot tubs, steam rooms, or saunas. °· Do not douche or use tampons or scented sanitary pads. °· Do not cross your legs for long periods of time. °· Avoid cat litter boxes and soil used by cats. These carry germs that can cause birth defects in the baby and possibly loss of the fetus by miscarriage or stillbirth. °· Avoid all smoking, herbs, alcohol, and medicines not prescribed by your health care provider. Chemicals in these affect the formation and growth of the baby. °· Schedule a dentist appointment. At home, brush your teeth with a soft toothbrush and be gentle when you floss. °SEEK MEDICAL CARE IF:  °· You have  dizziness. °· You have mild pelvic cramps, pelvic pressure, or nagging pain in the abdominal area. °· You have persistent nausea, vomiting, or diarrhea. °· You have a bad smelling vaginal discharge. °· You have pain with urination. °· You notice increased swelling in your face, hands, legs, or ankles. °SEEK IMMEDIATE MEDICAL CARE IF:  °· You have a fever. °· You are leaking fluid from your vagina. °· You have spotting or bleeding from your vagina. °· You have severe abdominal cramping or pain. °· You have rapid weight gain or loss. °· You vomit blood or material that looks like coffee grounds. °· You are exposed to German measles and have never had them. °· You are exposed to fifth disease or chickenpox. °· You develop a severe headache. °· You have shortness of breath. °· You have any kind of trauma, such as from a fall or a car accident. °Document Released: 03/01/2001 Document Revised: 07/22/2013 Document Reviewed: 01/15/2013 °ExitCare® Patient Information ©2015 [employer]. This information is not intended to replace advice given to you by your health care provider. Make sure you discuss any questions you have with your health care provider. °Bacterial Vaginosis °Bacterial vaginosis is a vaginal infection that occurs when the normal balance of bacteria in the vagina is disrupted. It results from an overgrowth of certain bacteria. This is the most common vaginal infection in women of childbearing age. Treatment is important to prevent complications, especially in pregnant women, as it can cause a premature delivery. °CAUSES  °Bacterial vaginosis is caused by an increase in harmful bacteria that are normally present in smaller amounts in the vagina. Several different kinds of bacteria can cause bacterial vaginosis. However, the reason that the condition develops is not fully understood. °RISK FACTORS °Certain activities or behaviors can put you at an increased risk of developing bacterial vaginosis,  including: °· Having a new sex partner or multiple sex partners. °· Douching. °· Using an intrauterine device (IUD) for contraception. °Women do not get bacterial vaginosis from toilet seats, bedding, swimming pools, or contact with objects around them. °SIGNS AND SYMPTOMS  °Some women with bacterial vaginosis have no signs or symptoms. Common symptoms include: °· Grey vaginal discharge. °· A fishlike odor with discharge, especially after sexual intercourse. °· Itching or burning of the vagina and vulva. °· Burning or pain with urination. °DIAGNOSIS  °Your health care provider will take a medical history and examine the vagina for signs of bacterial vaginosis. A sample of   vaginal fluid may be taken. Your health care provider will look at this sample under a microscope to check for bacteria and abnormal cells. A vaginal pH test may also be done.  °TREATMENT  °Bacterial vaginosis may be treated with antibiotic medicines. These may be given in the form of a pill or a vaginal cream. A second round of antibiotics may be prescribed if the condition comes back after treatment.  °HOME CARE INSTRUCTIONS  °· Only take over-the-counter or prescription medicines as directed by your health care provider. °· If antibiotic medicine was prescribed, take it as directed. Make sure you finish it even if you start to feel better. °· Do not have sex until treatment is completed. °· Tell all sexual partners that you have a vaginal infection. They should see their health care provider and be treated if they have problems, such as a mild rash or itching. °· Practice safe sex by using condoms and only having one sex partner. °SEEK MEDICAL CARE IF:  °· Your symptoms are not improving after 3 days of treatment. °· You have increased discharge or pain. °· You have a fever. °MAKE SURE YOU:  °· Understand these instructions. °· Will watch your condition. °· Will get help right away if you are not doing well or get worse. °FOR MORE INFORMATION   °Centers for Disease Control and Prevention, Division of STD Prevention: www.cdc.gov/std °American Sexual Health Association (ASHA): www.ashastd.org  °Document Released: 03/07/2005 Document Revised: 12/26/2012 Document Reviewed: 10/17/2012 °ExitCare® Patient Information ©2015 [employer]. This information is not intended to replace advice given to you by your health care provider. Make sure you discuss any questions you have with your health care provider. ° °

## 2014-05-20 NOTE — MAU Note (Signed)
Pt called by RN from lobby. No response from patient. Patient not in lobby.

## 2014-05-20 NOTE — MAU Note (Signed)
Rachael Wilson. Poe CNM checked with u/s earlier and was told pt would have about an hour wait with 2 pts ahead of her. Pt opted to wait but needed to pick up her children and bring back to hosp where someone would pick up her children. Pt had just left at 1650 when u/s called for her. U/S notified that pt would return in about 15mins per pt when she left to get her children from school

## 2014-05-20 NOTE — MAU Note (Signed)
U/S called and pt will go to u/s after finished with current pt. Ms Rachael Wilson aware and agrees

## 2014-05-21 LAB — HIV ANTIBODY (ROUTINE TESTING W REFLEX): HIV Screen 4th Generation wRfx: NONREACTIVE

## 2014-05-21 LAB — GC/CHLAMYDIA PROBE AMP (~~LOC~~) NOT AT ARMC
Chlamydia: NEGATIVE
NEISSERIA GONORRHEA: NEGATIVE

## 2014-05-29 ENCOUNTER — Telehealth: Payer: Self-pay | Admitting: Certified Nurse Midwife

## 2014-05-29 NOTE — Telephone Encounter (Signed)
Pt seen in triage. Pt left on 05/20/14 too early to get ultrasound results. She returned today to get results. I reorted to her a live twin pregnancy with a due date of 12/08/14. Pt is going to initiate prenatal care with her previous practice.

## 2014-06-04 ENCOUNTER — Inpatient Hospital Stay (HOSPITAL_COMMUNITY)
Admission: AD | Admit: 2014-06-04 | Discharge: 2014-06-05 | Disposition: A | Discharge status: Home / Self Care | Payer: Medicaid Other | Source: Ambulatory Visit | Attending: Obstetrics & Gynecology | Admitting: Obstetrics & Gynecology

## 2014-06-04 ENCOUNTER — Encounter (HOSPITAL_COMMUNITY): Payer: Self-pay | Admitting: *Deleted

## 2014-06-04 DIAGNOSIS — O99891 Other specified diseases and conditions complicating pregnancy: Secondary | ICD-10-CM

## 2014-06-04 DIAGNOSIS — O9989 Other specified diseases and conditions complicating pregnancy, childbirth and the puerperium: Secondary | ICD-10-CM | POA: Diagnosis not present

## 2014-06-04 DIAGNOSIS — Z3A13 13 weeks gestation of pregnancy: Secondary | ICD-10-CM | POA: Diagnosis not present

## 2014-06-04 DIAGNOSIS — K529 Noninfective gastroenteritis and colitis, unspecified: Secondary | ICD-10-CM | POA: Diagnosis not present

## 2014-06-04 DIAGNOSIS — A09 Infectious gastroenteritis and colitis, unspecified: Secondary | ICD-10-CM | POA: Diagnosis not present

## 2014-06-04 DIAGNOSIS — O210 Mild hyperemesis gravidarum: Secondary | ICD-10-CM | POA: Diagnosis present

## 2014-06-04 LAB — URINALYSIS, ROUTINE W REFLEX MICROSCOPIC
BILIRUBIN URINE: NEGATIVE
GLUCOSE, UA: NEGATIVE mg/dL
HGB URINE DIPSTICK: NEGATIVE
Ketones, ur: >80 mg/dL — AB
Leukocytes, UA: NEGATIVE
Nitrite: NEGATIVE
Protein, ur: 30 mg/dL — AB
Specific Gravity, Urine: >1.03 — ABNORMAL HIGH (ref 1.005–1.030)
Urobilinogen, UA: 0.2 mg/dL (ref 0.0–1.0)
pH: 6 (ref 5.0–8.0)

## 2014-06-04 LAB — URINE MICROSCOPIC-ADD ON

## 2014-06-04 MED ORDER — LACTATED RINGERS IV SOLN
INTRAVENOUS | Status: AC
  Administered 2014-06-05: 01:00:00 via INTRAVENOUS

## 2014-06-04 MED ORDER — PROMETHAZINE HCL 25 MG/ML IJ SOLN
25.0000 mg | Freq: Once | INTRAVENOUS | Status: AC
  Administered 2014-06-04: 25 mg via INTRAVENOUS
  Filled 2014-06-04: qty 1

## 2014-06-04 NOTE — MAU Note (Signed)
Pt states she started 2 days ago with a loss of appetite then she started vomiting and then she couldn't keep anything down

## 2014-06-04 NOTE — Progress Notes (Signed)
Pt states she has not be able to see out of her contacts or glasses vision is blurry

## 2014-06-04 NOTE — MAU Note (Signed)
Nausea/vomiting x 2 days; unable to keep anything down. Nausea meds don't work but doesn't know which meds she has. Lower abdominal pain x 2 days. Diarrhea x 2 days; watery & lose, more than 3 episodes. Temp of 100 at home.

## 2014-06-05 DIAGNOSIS — A09 Infectious gastroenteritis and colitis, unspecified: Secondary | ICD-10-CM | POA: Diagnosis not present

## 2014-06-05 MED ORDER — ONDANSETRON HCL 8 MG PO TABS: 8.0000 mg | ORAL_TABLET | Freq: Three times a day (TID) | ORAL | Status: DC | PRN

## 2014-06-05 MED ORDER — ONDANSETRON 8 MG/NS 50 ML IVPB
8.0000 mg | Freq: Once | INTRAVENOUS | Status: AC
  Administered 2014-06-05: 8 mg via INTRAVENOUS
  Filled 2014-06-05: qty 8

## 2014-06-05 MED ORDER — LOPERAMIDE HCL 2 MG PO TABS: 2.0000 mg | ORAL_TABLET | Freq: Four times a day (QID) | ORAL | Status: DC | PRN

## 2014-06-05 NOTE — MAU Provider Note (Signed)
Chief Complaint: Emesis During Pregnancy   First Provider Initiated Contact with Patient 06/04/14 2244     SUBJECTIVE HPI: Rachael Wilson is a 34 y.o. [id number] at [redacted]w[redacted]d by LMP who presents with nausea, vomiting, watery diarrhea 2 days. Temperature 100 at home. Denies sick contacts, vaginal bleeding. Live twin IUP verified by ultrasound 05/20/2014. Having generalized abdominal cramping with nausea, vomiting, diarrhea. Has not taken any antipyretics. Was prescribed Reglan for nausea and vomiting, but states is not working.  Plans to get prenatal care at [facility] with Medicaid is active.  Past Medical History  Diagnosis Date  . Polycystic ovarian syndrome   . IBS (irritable bowel syndrome)   . Pregnancy induced hypertension     with G1  . Gestational diabetes     diet controlled  . Sleep apnea     h/o sleep apnea when overweight   OB History  Gravida Para Term Preterm AB SAB TAB Ectopic Multiple Living  0 1 1 0 0 0 3    # Outcome Date GA Lbr Len/2nd Weight Sex Delivery Anes PTL Lv  6 Current           5 Term 08/13/11 [redacted]w[redacted]d   F CS-LTranv Spinal  Y  4 Term 09/2009 [redacted]w[redacted]d  4.252 kg (9 lb 6 oz)  CS-LTranv Spinal       Comments: repeat c/section  3 SAB 06/2006 [redacted]w[redacted]d         2 Term 07/2004 [redacted]w[redacted]d  4.281 kg (9 lb 7 oz)  CS-LTranv EPI       Comments: failure to progress  1 [place]              Past Surgical History  Procedure Laterality Date  . Cesarean section      x 2  . Dilation and curettage of uterus    . Cesarean section  08/13/2011    Procedure: CESAREAN SECTION;  Surgeon: Purcell Nails, MD;  Location: [facility];  Service: Gynecology;  Laterality: N/A;  Repeat cesarean section with delivery of baby girl at 24.  Apgars 8/8/9.   History   Social History  . Marital Status: Single    Spouse Name: N/A  . Number of Children: N/A  . Years of Education: N/A   Occupational History  . Not on file.   Social History Main Topics  . Smoking status: Never  Smoker   . Smokeless tobacco: Never Used  . Alcohol Use: No     Comment: occ. drink  . Drug Use: No  . Sexual Activity: Yes    Birth Control/ Protection: None     Comment: pt is pregnant   Other Topics Concern  . Not on file   Social History Narrative   No current facility-administered medications on file prior to encounter.   Current Outpatient Prescriptions on File Prior to Encounter  Medication Sig Dispense Refill  . metoCLOPramide (REGLAN) 10 MG tablet Take 1 tablet (10 mg total) by mouth every 6 (six) hours. 30 tablet 0  . Prenatal Vit-Fe Fumarate-FA (PRENATAL MULTIVITAMIN) TABS tablet Take 1 tablet by mouth daily at 12 noon.    . metroNIDAZOLE (FLAGYL) 500 MG tablet Take 1 tablet (500 mg total) by mouth 2 (two) times daily. (Patient not taking: Reported on 06/04/2014) 14 tablet 0   Allergies  Allergen Reactions  . Bactrim Hives and Other (See Comments)    Patient states that she gets blisters from this medication.  . Sulfa Antibiotics Hives  and Other (See Comments)    Patient states that she gets blisters from this medication.    Review of Systems  Constitutional: Positive for fever (low-grade). Negative for chills.  Respiratory: Negative for cough.   Gastrointestinal: Positive for nausea, vomiting, abdominal pain and diarrhea. Negative for blood in stool.  Genitourinary: Negative for dysuria, urgency, frequency, hematuria and flank pain.       Negative for vaginal bleeding, vaginal discharge.  Musculoskeletal: Negative for myalgias.     OBJECTIVE Blood pressure 130/78, pulse 101, temperature 98.7 F (37.1 C), temperature source Oral, resp. rate 16, height 5\' 3"  (1.6 m), weight 94.518 kg (208 lb 6 oz), last menstrual period 03/03/2014, SpO2 100 %, not currently breastfeeding. GENERAL: Well-developed, well-nourished female in no acute distress.  HEENT: Normocephalic. Mucous membranes dry. HEART: normal rate RESP: normal effort ABDOMEN: Soft, non-tender EXTREMITIES:  Nontender, no edema NEURO: Alert and oriented SPECULUM EXAM: Deferred due to recent exam Fetal heart rate 167/172 by Doppler.  LAB RESULTS Results for orders placed or performed during the hospital encounter of 06/04/14 (from the past 24 hour(s))  Urinalysis, Routine w reflex microscopic     Status: Abnormal   Collection Time: 06/04/14  8:56 PM  Result Value Ref Range   Color, Urine YELLOW YELLOW   APPearance CLEAR CLEAR   Specific Gravity, Urine >1.030 (H) 1.005 - 1.030   pH 6.0 5.0 - 8.0   Glucose, UA NEGATIVE NEGATIVE mg/dL   Hgb urine dipstick NEGATIVE NEGATIVE   Bilirubin Urine NEGATIVE NEGATIVE   Ketones, ur >80 (A) NEGATIVE mg/dL   Protein, ur 30 (A) NEGATIVE mg/dL   Urobilinogen, UA 0.2 0.0 - 1.0 mg/dL   Nitrite NEGATIVE NEGATIVE   Leukocytes, UA NEGATIVE NEGATIVE  Urine microscopic-add on     Status: Abnormal   Collection Time: 06/04/14  8:56 PM  Result Value Ref Range   Squamous Epithelial / LPF FEW (A) RARE   WBC, UA 3-6 <3 WBC/hpf   RBC / HPF 3-6 <3 RBC/hpf   Bacteria, UA FEW (A) RARE   Urine-Other MUCOUS PRESENT     IMAGING US Ob Comp Less 14 Wks  05/20/2014   CLINICAL DATA:  Acute onset of pelvic pain.  Initial encounter.  EXAM: TWIN OBSTETRICAL ULTRASOUND <14 WKS  COMPARISON:  Pelvic ultrasound performed 12/22/2013  FINDINGS: TWIN 1  Intrauterine gestational sac: Visualized/normal in shape.  Yolk sac:  No  Embryo:  Yes  Cardiac Activity: Yes  Heart Rate: 174 bpm  CRL:   4.46 cm   11 w 3 d                  [place] EDC: 12/06/2014  TWIN 2  Intrauterine gestational sac: Visualized/normal in shape.  Yolk sac:  No  Embryo:  Yes  Cardiac Activity: Yes  Heart Rate: 181 bpm  CRL:   4.58 cm   11 w 3 d                  [place] EDC: 12/06/2014  Maternal uterus/adnexae: No subchorionic hemorrhage is noted. The uterus is otherwise unremarkable in appearance. There appears to be a twin peak sign, likely reflecting a dichorionic diamniotic pregnancy.  The ovaries are unremarkable in  appearance. The right ovary measures 1.9 x 1.4 x 1.5 cm, while the left ovary measures 2.4 x 2.0 x 2.2 cm. No suspicious adnexal masses are seen; there is no evidence for ovarian torsion.  No free fluid is seen within the pelvic cul-de-sac.  IMPRESSION:  1. Live twin pregnancy noted, with crown-rump lengths of 4.5 cm and 4.6 cm, corresponding to a gestational age of [redacted] weeks 3 days. This matches the gestational age of [redacted] weeks 1 day by LMP, reflecting an estimated date of delivery of December 08, 2014. 2. Apparent twin peak sign noted, likely reflecting a dichorionic diamniotic pregnancy.   Electronically Signed   By: Roanna Raider M.D.   On: 05/20/2014 20:02   US Ob Comp Addl Gest Less 14 Wks  05/20/2014   CLINICAL DATA:  Acute onset of pelvic pain.  Initial encounter.  EXAM: TWIN OBSTETRICAL ULTRASOUND <14 WKS  COMPARISON:  Pelvic ultrasound performed 12/22/2013  FINDINGS: TWIN 1  Intrauterine gestational sac: Visualized/normal in shape.  Yolk sac:  No  Embryo:  Yes  Cardiac Activity: Yes  Heart Rate: 174 bpm  CRL:   4.46 cm   11 w 3 d                  [place] EDC: 12/06/2014  TWIN 2  Intrauterine gestational sac: Visualized/normal in shape.  Yolk sac:  No  Embryo:  Yes  Cardiac Activity: Yes  Heart Rate: 181 bpm  CRL:   4.58 cm   11 w 3 d                  [place] EDC: 12/06/2014  Maternal uterus/adnexae: No subchorionic hemorrhage is noted. The uterus is otherwise unremarkable in appearance. There appears to be a twin peak sign, likely reflecting a dichorionic diamniotic pregnancy.  The ovaries are unremarkable in appearance. The right ovary measures 1.9 x 1.4 x 1.5 cm, while the left ovary measures 2.4 x 2.0 x 2.2 cm. No suspicious adnexal masses are seen; there is no evidence for ovarian torsion.  No free fluid is seen within the pelvic cul-de-sac.  IMPRESSION: 1. Live twin pregnancy noted, with crown-rump lengths of 4.5 cm and 4.6 cm, corresponding to a gestational age of [redacted] weeks 3 days. This matches the gestational  age of [redacted] weeks 1 day by LMP, reflecting an estimated date of delivery of December 08, 2014. 2. Apparent twin peak sign noted, likely reflecting a dichorionic diamniotic pregnancy.   Electronically Signed   By: Roanna Raider M.D.   On: 05/20/2014 20:02    MAU COURSE Nausea and vomiting improved with Phenergan, but then returned. Zofran given. No further vomiting. tolerating fluids.  ASSESSMENT 1. Gastroenteritis presumed infectious   2. Current maternal conditions classifiable elsewhere, antepartum     PLAN Discharge home in stable condition.  Clear liquid diet x 24 hours, then advance as tolerated.      Follow-up Information    Please follow up.   Why:  Start prenatal care       Medication List    STOP taking these medications        metroNIDAZOLE 500 MG tablet  Commonly known as:  FLAGYL      TAKE these medications        loperamide 2 MG tablet  Commonly known as:  IMODIUM A-D  Take 1 tablet (2 mg total) by mouth 4 (four) times daily as needed for diarrhea or loose stools.     metoCLOPramide 10 MG tablet  Commonly known as:  REGLAN  Take 1 tablet (10 mg total) by mouth every 6 (six) hours.     ondansetron 8 MG tablet  Commonly known as:  ZOFRAN  Take 1 tablet (8 mg total) by mouth every 8 (eight) hours  as needed for nausea or vomiting.     prenatal multivitamin Tabs tablet  Take 1 tablet by mouth daily at 12 noon.       [place] Ty[place] Fede, CNM 06/05/2014  2:27 AM

## 2014-06-05 NOTE — Discharge Instructions (Signed)
Viral Gastroenteritis °Viral gastroenteritis is also known as stomach flu. This condition affects the stomach and intestinal tract. It can cause sudden diarrhea and vomiting. The illness typically lasts 3 to 8 days. Most people develop an immune response that eventually gets rid of the virus. While this natural response develops, the virus can make you quite ill. °CAUSES  °Many different viruses can cause gastroenteritis, such as rotavirus or noroviruses. You can catch one of these viruses by consuming contaminated food or water. You may also catch a virus by sharing utensils or other personal items with an infected person or by touching a contaminated surface. °SYMPTOMS  °The most common symptoms are diarrhea and vomiting. These problems can cause a severe loss of body fluids (dehydration) and a body salt (electrolyte) imbalance. Other symptoms may include: °· Fever. °· Headache. °· Fatigue. °· Abdominal pain. °DIAGNOSIS  °Your caregiver can usually diagnose viral gastroenteritis based on your symptoms and a physical exam. A stool sample may also be taken to test for the presence of viruses or other infections. °TREATMENT  °This illness typically goes away on its own. Treatments are aimed at rehydration. The most serious cases of viral gastroenteritis involve vomiting so severely that you are not able to keep fluids down. In these cases, fluids must be given through an intravenous line (IV). °HOME CARE INSTRUCTIONS  °· Drink enough fluids to keep your urine clear or pale yellow. Drink small amounts of fluids frequently and increase the amounts as tolerated. °· Ask your caregiver for specific rehydration instructions. °· Avoid: °· Foods high in sugar. °· Alcohol. °· Carbonated drinks. °· Tobacco. °· Juice. °· Caffeine drinks. °· Extremely hot or cold fluids. °· Fatty, greasy foods. °· Too much intake of anything at one time. °· Dairy products until 24 to 48 hours after diarrhea stops. °· You may consume probiotics.  Probiotics are active cultures of beneficial bacteria. They may lessen the amount and number of diarrheal stools in adults. Probiotics can be found in yogurt with active cultures and in supplements. °· Wash your hands well to avoid spreading the virus. °· Only take over-the-counter or prescription medicines for pain, discomfort, or fever as directed by your caregiver. Do not give aspirin to children. Antidiarrheal medicines are not recommended. °· Ask your caregiver if you should continue to take your regular prescribed and over-the-counter medicines. °· Keep all follow-up appointments as directed by your caregiver. °SEEK IMMEDIATE MEDICAL CARE IF:  °· You are unable to keep fluids down. °· You do not urinate at least once every 6 to 8 hours. °· You develop shortness of breath. °· You notice blood in your stool or vomit. This may look like coffee grounds. °· You have abdominal pain that increases or is concentrated in one small area (localized). °· You have persistent vomiting or diarrhea. °· You have a fever. °· The patient is a child younger than 3 months, and he or she has a fever. °· The patient is a child older than 3 months, and he or she has a fever and persistent symptoms. °· The patient is a child older than 3 months, and he or she has a fever and symptoms suddenly get worse. °· The patient is a baby, and he or she has no tears when crying. °MAKE SURE YOU:  °· Understand these instructions. °· Will watch your condition. °· Will get help right away if you are not doing well or get worse. °Document Released: 03/07/2005 Document Revised: 05/30/2011 Document Reviewed: 12/22/2010 °  [employer] Patient Information 2015 [employer] [place]. This information is not intended to replace advice given to you by your health care provider. Make sure you discuss any questions you have with your health care provider.  Second Trimester of Pregnancy The second trimester is from week 13 through week 28, months 4 through 6. The  second trimester is often a time when you feel your best. Your body has also adjusted to being pregnant, and you begin to feel better physically. Usually, morning sickness has lessened or quit completely, you may have more energy, and you may have an increase in appetite. The second trimester is also a time when the fetus is growing rapidly. At the end of the sixth month, the fetus is about 9 inches long and weighs about 1 pounds. You will likely begin to feel the baby move (quickening) between 18 and 20 weeks of the pregnancy. BODY CHANGES Your body goes through many changes during pregnancy. The changes vary from woman to woman.   Your weight will continue to increase. You will notice your lower abdomen bulging out.  You may begin to get stretch marks on your hips, abdomen, and breasts.  You may develop headaches that can be relieved by medicines approved by your health care provider.  You may urinate more often because the fetus is pressing on your bladder.  You may develop or continue to have heartburn as a result of your pregnancy.  You may develop constipation because certain hormones are causing the muscles that push waste through your intestines to slow down.  You may develop hemorrhoids or swollen, bulging veins (varicose veins).  You may have back pain because of the weight gain and pregnancy hormones relaxing your joints between the bones in your pelvis and as a result of a shift in weight and the muscles that support your balance.  Your breasts will continue to grow and be tender.  Your gums may bleed and may be sensitive to brushing and flossing.  Dark spots or blotches (chloasma, mask of pregnancy) may develop on your face. This will likely fade after the baby is born.  A dark line from your belly button to the pubic area (linea nigra) may appear. This will likely fade after the baby is born.  You may have changes in your hair. These can include thickening of your hair,  rapid growth, and changes in texture. Some women also have hair loss during or after pregnancy, or hair that feels dry or thin. Your hair will most likely return to normal after your baby is born. WHAT TO EXPECT AT YOUR PRENATAL VISITS During a routine prenatal visit:  You will be weighed to make sure you and the fetus are growing normally.  Your blood pressure will be taken.  Your abdomen will be measured to track your baby's growth.  The fetal heartbeat will be listened to.  Any test results from the previous visit will be discussed. Your health care provider may ask you:  How you are feeling.  If you are feeling the baby move.  If you have had any abnormal symptoms, such as leaking fluid, bleeding, severe headaches, or abdominal cramping.  If you have any questions. Other tests that may be performed during your second trimester include:  Blood tests that check for:  Low iron levels (anemia).  Gestational diabetes (between 24 and 28 weeks).  Rh antibodies.  Urine tests to check for infections, diabetes, or protein in the urine.  An ultrasound to confirm the  proper growth and development of the baby.  An amniocentesis to check for possible genetic problems.  Fetal screens for spina bifida and Down syndrome. HOME CARE INSTRUCTIONS   Avoid all smoking, herbs, alcohol, and unprescribed drugs. These chemicals affect the formation and growth of the baby.  Follow your health care provider's instructions regarding medicine use. There are medicines that are either safe or unsafe to take during pregnancy.  Exercise only as directed by your health care provider. Experiencing uterine cramps is a good sign to stop exercising.  Continue to eat regular, healthy meals.  Wear a good support bra for breast tenderness.  Do not use hot tubs, steam rooms, or saunas.  Wear your seat belt at all times when driving.  Avoid raw meat, uncooked cheese, cat litter boxes, and soil used by  cats. These carry germs that can cause birth defects in the baby.  Take your prenatal vitamins.  Try taking a stool softener (if your health care provider approves) if you develop constipation. Eat more high-fiber foods, such as fresh vegetables or fruit and whole grains. Drink plenty of fluids to keep your urine clear or pale yellow.  Take warm sitz baths to soothe any pain or discomfort caused by hemorrhoids. Use hemorrhoid cream if your health care provider approves.  If you develop varicose veins, wear support hose. Elevate your feet for 15 minutes, 3-4 times a day. Limit salt in your diet.  Avoid heavy lifting, wear low heel shoes, and practice good posture.  Rest with your legs elevated if you have leg cramps or low back pain.  Visit your dentist if you have not gone yet during your pregnancy. Use a soft toothbrush to brush your teeth and be gentle when you floss.  A sexual relationship may be continued unless your health care provider directs you otherwise.  Continue to go to all your prenatal visits as directed by your health care provider. SEEK MEDICAL CARE IF:   You have dizziness.  You have mild pelvic cramps, pelvic pressure, or nagging pain in the abdominal area.  You have persistent nausea, vomiting, or diarrhea.  You have a bad smelling vaginal discharge.  You have pain with urination. SEEK IMMEDIATE MEDICAL CARE IF:   You have a fever.  You are leaking fluid from your vagina.  You have spotting or bleeding from your vagina.  You have severe abdominal cramping or pain.  You have rapid weight gain or loss.  You have shortness of breath with chest pain.  You notice sudden or extreme swelling of your face, hands, ankles, feet, or legs.  You have not felt your baby move in over an hour.  You have severe headaches that do not go away with medicine.  You have vision changes. Document Released: 03/01/2001 Document Revised: 03/12/2013 Document Reviewed:  05/08/2012 [facility] Patient Information 2015 [place], [place]. This information is not intended to replace advice given to you by your health care provider. Make sure you discuss any questions you have with your health care provider.

## 2014-06-05 NOTE — Progress Notes (Signed)
Pt received discharge instructions from Ivonne AndrewV. Smith CNM. Pt instructed to drink fluids and progress to bland foods. Pt provided with prescriptions for nausea medication and diarrhea. No diarrhea noted.

## 2014-06-20 LAB — OB RESULTS CONSOLE HEPATITIS B SURFACE ANTIGEN: Hepatitis B Surface Ag: NEGATIVE

## 2014-06-20 LAB — OB RESULTS CONSOLE RUBELLA ANTIBODY, IGM: Rubella: IMMUNE

## 2014-06-20 LAB — OB RESULTS CONSOLE ABO/RH: RH Type: POSITIVE

## 2014-06-20 LAB — OB RESULTS CONSOLE ANTIBODY SCREEN: Antibody Screen: NEGATIVE

## 2014-06-20 LAB — OB RESULTS CONSOLE HIV ANTIBODY (ROUTINE TESTING): HIV: NONREACTIVE

## 2014-06-20 LAB — OB RESULTS CONSOLE RPR: RPR: NONREACTIVE

## 2014-08-23 ENCOUNTER — Inpatient Hospital Stay (HOSPITAL_COMMUNITY)
Admission: AD | Admit: 2014-08-23 | Discharge: 2014-08-24 | Disposition: A | Discharge status: Home / Self Care | Payer: Medicaid Other | Source: Ambulatory Visit | Attending: Obstetrics & Gynecology | Admitting: Obstetrics & Gynecology

## 2014-08-23 ENCOUNTER — Encounter (HOSPITAL_COMMUNITY): Payer: Self-pay

## 2014-08-23 ENCOUNTER — Inpatient Hospital Stay (HOSPITAL_COMMUNITY): Payer: Medicaid Other

## 2014-08-23 DIAGNOSIS — O9989 Other specified diseases and conditions complicating pregnancy, childbirth and the puerperium: Secondary | ICD-10-CM | POA: Diagnosis not present

## 2014-08-23 DIAGNOSIS — O3421 Maternal care for scar from previous cesarean delivery: Secondary | ICD-10-CM | POA: Diagnosis not present

## 2014-08-23 DIAGNOSIS — R109 Unspecified abdominal pain: Secondary | ICD-10-CM | POA: Diagnosis not present

## 2014-08-23 DIAGNOSIS — Z3A24 24 weeks gestation of pregnancy: Secondary | ICD-10-CM | POA: Insufficient documentation

## 2014-08-23 DIAGNOSIS — O30042 Twin pregnancy, dichorionic/diamniotic, second trimester: Secondary | ICD-10-CM | POA: Insufficient documentation
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
## 2014-08-23 NOTE — Progress Notes (Signed)
Pt snoring lying on R side

## 2014-08-24 DIAGNOSIS — O30042 Twin pregnancy, dichorionic/diamniotic, second trimester: Secondary | ICD-10-CM | POA: Insufficient documentation

## 2014-08-24 NOTE — Progress Notes (Signed)
Gerrit HeckJessica Emly CNM given update as to pt's status. Will see pt
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
## 2014-08-24 NOTE — Progress Notes (Signed)
Jessica Emly CNM in earlier to discuss d/c plan. Written and verbal d/c instructions given and understanding voiced 

## 2014-09-10 ENCOUNTER — Encounter (HOSPITAL_COMMUNITY): Payer: Self-pay | Admitting: *Deleted

## 2014-09-10 ENCOUNTER — Inpatient Hospital Stay (HOSPITAL_COMMUNITY): Payer: Medicaid Other

## 2014-09-10 ENCOUNTER — Inpatient Hospital Stay (HOSPITAL_COMMUNITY)
Admission: AD | Admit: 2014-09-10 | Discharge: 2014-09-10 | Disposition: A | Discharge status: Home / Self Care | Payer: Medicaid Other | Source: Ambulatory Visit | Attending: Obstetrics and Gynecology | Admitting: Obstetrics and Gynecology

## 2014-09-10 DIAGNOSIS — O30042 Twin pregnancy, dichorionic/diamniotic, second trimester: Secondary | ICD-10-CM | POA: Diagnosis not present

## 2014-09-10 DIAGNOSIS — IMO0001 Reserved for inherently not codable concepts without codable children: Secondary | ICD-10-CM

## 2014-09-10 DIAGNOSIS — O9989 Other specified diseases and conditions complicating pregnancy, childbirth and the puerperium: Secondary | ICD-10-CM | POA: Insufficient documentation

## 2014-09-10 DIAGNOSIS — Z3A27 27 weeks gestation of pregnancy: Secondary | ICD-10-CM | POA: Diagnosis not present

## 2014-09-10 DIAGNOSIS — S3991XA Unspecified injury of abdomen, initial encounter: Secondary | ICD-10-CM

## 2014-09-10 DIAGNOSIS — O9A219 Injury, poisoning and certain other consequences of external causes complicating pregnancy, unspecified trimester: Secondary | ICD-10-CM | POA: Insufficient documentation

## 2014-09-10 LAB — URINE MICROSCOPIC-ADD ON

## 2014-09-10 LAB — URINALYSIS, ROUTINE W REFLEX MICROSCOPIC
Bilirubin Urine: NEGATIVE
Glucose, UA: NEGATIVE mg/dL
Hgb urine dipstick: NEGATIVE
Ketones, ur: NEGATIVE mg/dL
LEUKOCYTES UA: NEGATIVE
NITRITE: NEGATIVE
PROTEIN: 30 mg/dL — AB
SPECIFIC GRAVITY, URINE: 1.02 (ref 1.005–1.030)
UROBILINOGEN UA: 0.2 mg/dL (ref 0.0–1.0)
pH: 6.5 (ref 5.0–8.0)

## 2014-09-10 MED ORDER — ACETAMINOPHEN 325 MG PO TABS
650.0000 mg | ORAL_TABLET | Freq: Four times a day (QID) | ORAL | Status: DC | PRN
  Filled 2014-09-10: qty 2
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
## 2014-09-10 NOTE — Discharge Instructions (Signed)
Continue to monitor babies' activity. Use heat to sore areas (heating pad, [employer], [place], etc). Call for any bleeding, leaking, severe pain, or any concerns about babies not moving. Tylenol OK if needed for pain.
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
## 2014-10-19 ENCOUNTER — Encounter (HOSPITAL_COMMUNITY): Payer: Self-pay | Admitting: *Deleted

## 2014-10-19 ENCOUNTER — Inpatient Hospital Stay (HOSPITAL_COMMUNITY)
Admission: AD | Admit: 2014-10-19 | Discharge: 2014-10-19 | Disposition: A | Discharge status: Home / Self Care | Payer: Medicaid Other | Source: Ambulatory Visit | Attending: Obstetrics and Gynecology | Admitting: Obstetrics and Gynecology

## 2014-10-19 DIAGNOSIS — O9989 Other specified diseases and conditions complicating pregnancy, childbirth and the puerperium: Secondary | ICD-10-CM | POA: Insufficient documentation

## 2014-10-19 DIAGNOSIS — M549 Dorsalgia, unspecified: Secondary | ICD-10-CM | POA: Diagnosis not present

## 2014-10-19 DIAGNOSIS — R109 Unspecified abdominal pain: Secondary | ICD-10-CM | POA: Diagnosis present

## 2014-10-19 DIAGNOSIS — O99891 Other specified diseases and conditions complicating pregnancy: Secondary | ICD-10-CM

## 2014-10-19 DIAGNOSIS — Z3A33 33 weeks gestation of pregnancy: Secondary | ICD-10-CM | POA: Insufficient documentation

## 2014-10-19 LAB — URINALYSIS, ROUTINE W REFLEX MICROSCOPIC
BILIRUBIN URINE: NEGATIVE
Ketones, ur: 15 mg/dL — AB
Leukocytes, UA: NEGATIVE
Nitrite: NEGATIVE
PH: 6 (ref 5.0–8.0)
Protein, ur: NEGATIVE mg/dL
Specific Gravity, Urine: 1.02 (ref 1.005–1.030)
Urobilinogen, UA: 0.2 mg/dL (ref 0.0–1.0)

## 2014-10-19 LAB — URINE MICROSCOPIC-ADD ON

## 2014-10-19 MED ORDER — OXYCODONE-ACETAMINOPHEN 5-325 MG PO TABS
1.0000 | ORAL_TABLET | Freq: Once | ORAL | Status: AC
  Administered 2014-10-19: 1 via ORAL
  Filled 2014-10-19: qty 2

## 2014-10-19 MED ORDER — OXYCODONE-ACETAMINOPHEN 5-325 MG PO TABS: 1.0000 | ORAL_TABLET | Freq: Four times a day (QID) | ORAL | Status: DC | PRN

## 2014-10-19 MED ORDER — ACETAMINOPHEN 500 MG PO TABS: 1000.0000 mg | ORAL_TABLET | Freq: Once | ORAL | Status: DC

## 2014-10-19 NOTE — Discharge Instructions (Signed)

## 2014-10-19 NOTE — MAU Note (Signed)
Pt presents to MAU with complaints of lower abdominal cramping for a couple of weeks that got worse today. Denies any vaginal bleeding or LOF
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
## 2014-10-21 LAB — CULTURE, OB URINE

## 2014-11-13 ENCOUNTER — Inpatient Hospital Stay (HOSPITAL_COMMUNITY)
Admission: AD | Admit: 2014-11-13 | Discharge: 2014-11-13 | Disposition: A | Discharge status: Home / Self Care | Payer: Medicaid Other | Source: Ambulatory Visit | Attending: Obstetrics and Gynecology | Admitting: Obstetrics and Gynecology

## 2014-11-13 ENCOUNTER — Encounter (HOSPITAL_COMMUNITY): Payer: Self-pay | Admitting: Obstetrics and Gynecology

## 2014-11-13 ENCOUNTER — Inpatient Hospital Stay (HOSPITAL_COMMUNITY): Payer: Medicaid Other

## 2014-11-13 DIAGNOSIS — O322XX1 Maternal care for transverse and oblique lie, fetus 1: Secondary | ICD-10-CM | POA: Diagnosis not present

## 2014-11-13 DIAGNOSIS — O3421 Maternal care for scar from previous cesarean delivery: Secondary | ICD-10-CM | POA: Diagnosis not present

## 2014-11-13 DIAGNOSIS — O09293 Supervision of pregnancy with other poor reproductive or obstetric history, third trimester: Secondary | ICD-10-CM | POA: Insufficient documentation

## 2014-11-13 DIAGNOSIS — Z3A36 36 weeks gestation of pregnancy: Secondary | ICD-10-CM | POA: Insufficient documentation

## 2014-11-13 DIAGNOSIS — O365932 Maternal care for other known or suspected poor fetal growth, third trimester, fetus 2: Secondary | ICD-10-CM | POA: Insufficient documentation

## 2014-11-13 DIAGNOSIS — O30009 Twin pregnancy, unspecified number of placenta and unspecified number of amniotic sacs, unspecified trimester: Secondary | ICD-10-CM | POA: Diagnosis present

## 2014-11-13 DIAGNOSIS — O321XX2 Maternal care for breech presentation, fetus 2: Secondary | ICD-10-CM | POA: Insufficient documentation

## 2014-11-13 DIAGNOSIS — O30043 Twin pregnancy, dichorionic/diamniotic, third trimester: Secondary | ICD-10-CM | POA: Diagnosis not present

## 2014-11-13 DIAGNOSIS — O365931 Maternal care for other known or suspected poor fetal growth, third trimester, fetus 1: Secondary | ICD-10-CM | POA: Diagnosis not present

## 2014-11-13 DIAGNOSIS — O9989 Other specified diseases and conditions complicating pregnancy, childbirth and the puerperium: Secondary | ICD-10-CM | POA: Diagnosis present

## 2014-11-13 DIAGNOSIS — O365990 Maternal care for other known or suspected poor fetal growth, unspecified trimester, not applicable or unspecified: Secondary | ICD-10-CM

## 2014-11-13 DIAGNOSIS — O365930 Maternal care for other known or suspected poor fetal growth, third trimester, not applicable or unspecified: Secondary | ICD-10-CM | POA: Diagnosis present
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
## 2014-11-13 NOTE — Discharge Instructions (Signed)
Fetal Movement Counts °Performing a fetal movement count is highly recommended in high-risk pregnancies, but it is good for every pregnant woman to do. Your health care provider may ask you to start counting fetal movements at 28 weeks of the pregnancy. Fetal movements often increase: °· After eating a full meal. °· After physical activity. °· After eating or drinking something sweet or cold. °· At rest. °Pay attention to when you feel the baby is most active. This will help you notice a pattern of your baby's sleep and wake cycles and what factors contribute to an increase in fetal movement. It is important to perform a fetal movement count at the same time each day when your baby is normally most active.  °HOW TO COUNT FETAL MOVEMENTS °1. Find a quiet and comfortable area to sit or lie down on your left side. Lying on your left side provides the best blood and oxygen circulation to your baby. °2. Write down the day and time on a sheet of paper or in a journal. °3. Start counting kicks, flutters, swishes, rolls, or jabs in a 2-hour period. You should feel at least 10 movements within 2 hours. °4. If you do not feel 10 movements in 2 hours, wait 2-3 hours and count again. Look for a change in the pattern or not enough counts in 2 hours. °SEEK MEDICAL CARE IF: °· You feel less than 10 counts in 2 hours, tried twice. °· There is no movement in over an hour. °· The pattern is changing or taking longer each day to reach 10 counts in 2 hours. °· You feel the baby is not moving as he or she usually does. ° °

## 2014-11-14 ENCOUNTER — Other Ambulatory Visit: Payer: Self-pay | Admitting: Obstetrics and Gynecology

## 2014-11-18 ENCOUNTER — Encounter (HOSPITAL_COMMUNITY): Payer: Self-pay

## 2014-11-18 ENCOUNTER — Encounter (HOSPITAL_COMMUNITY)
Admission: RE | Admit: 2014-11-18 | Discharge: 2014-11-18 | Disposition: A | Discharge status: Home / Self Care | Payer: Medicaid Other | Source: Ambulatory Visit | Attending: Obstetrics and Gynecology | Admitting: Obstetrics and Gynecology

## 2014-11-18 LAB — CBC
HCT: 30.8 % — ABNORMAL LOW (ref 36.0–46.0)
Hemoglobin: 9.6 g/dL — ABNORMAL LOW (ref 12.0–15.0)
MCH: 23.5 pg — AB (ref 26.0–34.0)
MCHC: 31.2 g/dL (ref 30.0–36.0)
MCV: 75.5 fL — AB (ref 78.0–100.0)
PLATELETS: 238 10*3/uL (ref 150–400)
RBC: 4.08 MIL/uL (ref 3.87–5.11)
RDW: 16.4 % — AB (ref 11.5–15.5)
WBC: 6.3 10*3/uL (ref 4.0–10.5)

## 2014-11-18 NOTE — Patient Instructions (Addendum)
Your procedure is scheduled on:11/19/14  Enter through the Main Entrance at :1pm Pick up desk phone and dial [phone] and inform us of your arrival.  Please call [phone] if you have any problems the morning of surgery.  Remember: Do not eat food after midnight: Clear liquids are ok until:10 am   You may brush your teeth the morning of surgery.    DO NOT wear jewelry, eye make-up, lipstick,body lotion, or dark fingernail polish.  (Polished toes are ok) You may wear deodorant.  If you are to be admitted after surgery, leave suitcase in car until your room has been assigned. Patients discharged on the day of surgery will not be allowed to drive home. Wear loose fitting, comfortable clothes for your ride home.

## 2014-11-18 NOTE — Pre-Procedure Instructions (Signed)
Patient was very late for her PAT appointment. She was distracted and I had difficulty getting her to provide answers to my questions because she was on her cell phone. I showed her the sign asking her to turn off her phone, she put it down on table but didn't turn it off and kept using it when she'd get a notification.I told her that I needed her attention and she still texted during appt. 

## 2014-11-18 NOTE — Pre-Procedure Instructions (Signed)
Patient was very late for her PAT appointment. She was distracted and I had difficulty getting her to provide answers to my questions because she was on her cell phone. I showed her the sign asking her to turn off her phone, she put it down on table but didn't turn it off and kept using it when she'd get a notification.I told her that I needed her attention and she still texted during appt.

## 2014-11-19 ENCOUNTER — Inpatient Hospital Stay (HOSPITAL_COMMUNITY): Payer: Medicaid Other | Admitting: Anesthesiology

## 2014-11-19 ENCOUNTER — Encounter (HOSPITAL_COMMUNITY): Payer: Self-pay | Admitting: Anesthesiology

## 2014-11-19 ENCOUNTER — Encounter (HOSPITAL_COMMUNITY)
Admission: RE | Disposition: A | Discharge status: Home / Self Care | Payer: Self-pay | Source: Ambulatory Visit | Attending: Obstetrics and Gynecology

## 2014-11-19 ENCOUNTER — Inpatient Hospital Stay (HOSPITAL_COMMUNITY)
Admission: RE | Admit: 2014-11-19 | Discharge: 2014-11-25 | DRG: 765 | Disposition: A | Discharge status: Home / Self Care | Payer: Medicaid Other | Source: Ambulatory Visit | Attending: Obstetrics and Gynecology | Admitting: Obstetrics and Gynecology

## 2014-11-19 DIAGNOSIS — K219 Gastro-esophageal reflux disease without esophagitis: Secondary | ICD-10-CM | POA: Diagnosis present

## 2014-11-19 DIAGNOSIS — Z833 Family history of diabetes mellitus: Secondary | ICD-10-CM | POA: Diagnosis not present

## 2014-11-19 DIAGNOSIS — Z658 Other specified problems related to psychosocial circumstances: Secondary | ICD-10-CM

## 2014-11-19 DIAGNOSIS — O9081 Anemia of the puerperium: Secondary | ICD-10-CM | POA: Diagnosis not present

## 2014-11-19 DIAGNOSIS — O3421 Maternal care for scar from previous cesarean delivery: Principal | ICD-10-CM | POA: Diagnosis present

## 2014-11-19 DIAGNOSIS — O9962 Diseases of the digestive system complicating childbirth: Secondary | ICD-10-CM | POA: Diagnosis present

## 2014-11-19 DIAGNOSIS — R140 Abdominal distension (gaseous): Secondary | ICD-10-CM

## 2014-11-19 DIAGNOSIS — G8918 Other acute postprocedural pain: Secondary | ICD-10-CM

## 2014-11-19 DIAGNOSIS — Z6841 Body Mass Index (BMI) 40.0 and over, adult: Secondary | ICD-10-CM

## 2014-11-19 DIAGNOSIS — K567 Ileus, unspecified: Secondary | ICD-10-CM | POA: Diagnosis not present

## 2014-11-19 DIAGNOSIS — O30043 Twin pregnancy, dichorionic/diamniotic, third trimester: Secondary | ICD-10-CM | POA: Diagnosis present

## 2014-11-19 DIAGNOSIS — O99214 Obesity complicating childbirth: Secondary | ICD-10-CM | POA: Diagnosis present

## 2014-11-19 DIAGNOSIS — R109 Unspecified abdominal pain: Secondary | ICD-10-CM

## 2014-11-19 DIAGNOSIS — Z3A37 37 weeks gestation of pregnancy: Secondary | ICD-10-CM | POA: Diagnosis not present

## 2014-11-19 DIAGNOSIS — Z98891 History of uterine scar from previous surgery: Secondary | ICD-10-CM

## 2014-11-19 DIAGNOSIS — D649 Anemia, unspecified: Secondary | ICD-10-CM | POA: Diagnosis not present

## 2014-11-19 DIAGNOSIS — O321XX1 Maternal care for breech presentation, fetus 1: Secondary | ICD-10-CM | POA: Diagnosis present

## 2014-11-19 DIAGNOSIS — Z302 Encounter for sterilization: Secondary | ICD-10-CM | POA: Diagnosis not present

## 2014-11-19 HISTORY — PX: BILATERAL SALPINGECTOMY: SHX5743

## 2014-11-19 LAB — PREPARE RBC (CROSSMATCH)

## 2014-11-19 LAB — RPR: RPR: NONREACTIVE

## 2014-11-19 SURGERY — Surgical Case
Anesthesia: Spinal | Site: Abdomen

## 2014-11-19 MED ORDER — TETANUS-DIPHTH-ACELL PERTUSSIS 5-2.5-18.5 LF-MCG/0.5 IM SUSP: 0.5000 mL | Freq: Once | INTRAMUSCULAR | Status: DC

## 2014-11-19 MED ORDER — LACTATED RINGERS IV SOLN
INTRAVENOUS | Status: DC | PRN
  Administered 2014-11-19: 16:00:00 via INTRAVENOUS

## 2014-11-19 MED ORDER — MEPERIDINE HCL 25 MG/ML IJ SOLN: 6.2500 mg | INTRAMUSCULAR | Status: DC | PRN

## 2014-11-19 MED ORDER — DIPHENHYDRAMINE HCL 25 MG PO CAPS
25.0000 mg | ORAL_CAPSULE | ORAL | Status: DC | PRN
  Filled 2014-11-19: qty 1

## 2014-11-19 MED ORDER — OXYCODONE-ACETAMINOPHEN 5-325 MG PO TABS: 1.0000 | ORAL_TABLET | ORAL | Status: DC | PRN

## 2014-11-19 MED ORDER — SIMETHICONE 80 MG PO CHEW: 80.0000 mg | CHEWABLE_TABLET | ORAL | Status: DC | PRN

## 2014-11-19 MED ORDER — MORPHINE SULFATE (PF) 0.5 MG/ML IJ SOLN
INTRAMUSCULAR | Status: DC | PRN
  Administered 2014-11-19: .15 mg via INTRATHECAL

## 2014-11-19 MED ORDER — ONDANSETRON HCL 4 MG/2ML IJ SOLN
INTRAMUSCULAR | Status: AC
  Filled 2014-11-19: qty 2

## 2014-11-19 MED ORDER — KETOROLAC TROMETHAMINE 30 MG/ML IJ SOLN
30.0000 mg | Freq: Four times a day (QID) | INTRAMUSCULAR | Status: DC | PRN
Start: 2014-11-19 — End: 2014-11-19

## 2014-11-19 MED ORDER — NALBUPHINE HCL 10 MG/ML IJ SOLN: 5.0000 mg | INTRAMUSCULAR | Status: DC | PRN

## 2014-11-19 MED ORDER — NALBUPHINE HCL 10 MG/ML IJ SOLN: 5.0000 mg | Freq: Once | INTRAMUSCULAR | Status: DC | PRN

## 2014-11-19 MED ORDER — FENTANYL CITRATE (PF) 100 MCG/2ML IJ SOLN
INTRAMUSCULAR | Status: DC | PRN
  Administered 2014-11-19: 75 ug via INTRAVENOUS
  Administered 2014-11-19: 25 ug via INTRATHECAL

## 2014-11-19 MED ORDER — OXYTOCIN 40 UNITS IN LACTATED RINGERS INFUSION - SIMPLE MED: 62.5000 mL/h | INTRAVENOUS | Status: AC

## 2014-11-19 MED ORDER — SODIUM CHLORIDE 0.9 % IJ SOLN: 3.0000 mL | INTRAMUSCULAR | Status: DC | PRN

## 2014-11-19 MED ORDER — DIBUCAINE 1 % RE OINT: 1.0000 "application " | TOPICAL_OINTMENT | RECTAL | Status: DC | PRN

## 2014-11-19 MED ORDER — SENNOSIDES-DOCUSATE SODIUM 8.6-50 MG PO TABS
2.0000 | ORAL_TABLET | ORAL | Status: DC
  Administered 2014-11-20 – 2014-11-25 (×6): 2 via ORAL
  Filled 2014-11-19 (×5): qty 2

## 2014-11-19 MED ORDER — MORPHINE SULFATE 0.5 MG/ML IJ SOLN
INTRAMUSCULAR | Status: AC
  Filled 2014-11-19: qty 100

## 2014-11-19 MED ORDER — BUPIVACAINE IN DEXTROSE 0.75-8.25 % IT SOLN
INTRATHECAL | Status: DC | PRN
  Administered 2014-11-19: 1.4 mL via INTRATHECAL

## 2014-11-19 MED ORDER — SCOPOLAMINE 1 MG/3DAYS TD PT72
MEDICATED_PATCH | TRANSDERMAL | Status: AC
  Administered 2014-11-19: 1.5 mg via TRANSDERMAL
  Filled 2014-11-19: qty 1

## 2014-11-19 MED ORDER — ONDANSETRON HCL 4 MG/2ML IJ SOLN
4.0000 mg | Freq: Three times a day (TID) | INTRAMUSCULAR | Status: DC | PRN
Start: 2014-11-19 — End: 2014-11-19

## 2014-11-19 MED ORDER — CEFAZOLIN SODIUM-DEXTROSE 2-3 GM-% IV SOLR
2.0000 g | Freq: Once | INTRAVENOUS | Status: AC
  Administered 2014-11-19: 2 g via INTRAVENOUS

## 2014-11-19 MED ORDER — SCOPOLAMINE 1 MG/3DAYS TD PT72
1.0000 | MEDICATED_PATCH | Freq: Once | TRANSDERMAL | Status: DC
  Administered 2014-11-19: 1.5 mg via TRANSDERMAL

## 2014-11-19 MED ORDER — PRENATAL MULTIVITAMIN CH
1.0000 | ORAL_TABLET | Freq: Every day | ORAL | Status: DC
  Administered 2014-11-20 – 2014-11-25 (×2): 1 via ORAL
  Filled 2014-11-19 (×2): qty 1

## 2014-11-19 MED ORDER — IBUPROFEN 600 MG PO TABS: 600.0000 mg | ORAL_TABLET | Freq: Four times a day (QID) | ORAL | Status: DC | PRN

## 2014-11-19 MED ORDER — FENTANYL CITRATE (PF) 100 MCG/2ML IJ SOLN
INTRAMUSCULAR | Status: AC
  Filled 2014-11-19: qty 2

## 2014-11-19 MED ORDER — MENTHOL 3 MG MT LOZG: 1.0000 | LOZENGE | OROMUCOSAL | Status: DC | PRN

## 2014-11-19 MED ORDER — ACETAMINOPHEN 325 MG PO TABS
650.0000 mg | ORAL_TABLET | ORAL | Status: DC | PRN
Start: 2014-11-19 — End: 2014-11-25
  Administered 2014-11-23: 650 mg via ORAL
  Filled 2014-11-19: qty 2

## 2014-11-19 MED ORDER — NALOXONE HCL 0.4 MG/ML IJ SOLN: 0.4000 mg | INTRAMUSCULAR | Status: DC | PRN

## 2014-11-19 MED ORDER — ZOLPIDEM TARTRATE 5 MG PO TABS: 5.0000 mg | ORAL_TABLET | Freq: Every evening | ORAL | Status: DC | PRN

## 2014-11-19 MED ORDER — ONDANSETRON HCL 4 MG/2ML IJ SOLN
INTRAMUSCULAR | Status: DC | PRN
  Administered 2014-11-19: 4 mg via INTRAVENOUS

## 2014-11-19 MED ORDER — ERYTHROMYCIN 5 MG/GM OP OINT
TOPICAL_OINTMENT | OPHTHALMIC | Status: AC
  Filled 2014-11-19: qty 1

## 2014-11-19 MED ORDER — PHENYLEPHRINE 8 MG IN D5W 100 ML (0.08MG/ML) PREMIX OPTIME
INJECTION | INTRAVENOUS | Status: DC | PRN
  Administered 2014-11-19: 60 ug/min via INTRAVENOUS

## 2014-11-19 MED ORDER — NALOXONE HCL 1 MG/ML IJ SOLN
1.0000 ug/kg/h | INTRAVENOUS | Status: DC | PRN
  Filled 2014-11-19: qty 2

## 2014-11-19 MED ORDER — LANOLIN HYDROUS EX OINT: 1.0000 "application " | TOPICAL_OINTMENT | CUTANEOUS | Status: DC | PRN

## 2014-11-19 MED ORDER — SCOPOLAMINE 1 MG/3DAYS TD PT72: 1.0000 | MEDICATED_PATCH | Freq: Once | TRANSDERMAL | Status: DC

## 2014-11-19 MED ORDER — LACTATED RINGERS IV SOLN
INTRAVENOUS | Status: DC
  Administered 2014-11-19 (×3): via INTRAVENOUS

## 2014-11-19 MED ORDER — IBUPROFEN 600 MG PO TABS
600.0000 mg | ORAL_TABLET | Freq: Four times a day (QID) | ORAL | Status: DC
  Administered 2014-11-20 – 2014-11-25 (×15): 600 mg via ORAL
  Filled 2014-11-19 (×15): qty 1

## 2014-11-19 MED ORDER — DIPHENHYDRAMINE HCL 25 MG PO CAPS
25.0000 mg | ORAL_CAPSULE | Freq: Four times a day (QID) | ORAL | Status: DC | PRN
  Administered 2014-11-20: 25 mg via ORAL
  Filled 2014-11-19: qty 1

## 2014-11-19 MED ORDER — SODIUM BICARBONATE 8.4 % IV SOLN
INTRAVENOUS | Status: AC
Start: 2014-11-19 — End: 2014-11-19
  Filled 2014-11-19: qty 50

## 2014-11-19 MED ORDER — METOCLOPRAMIDE HCL 5 MG/ML IJ SOLN: 10.0000 mg | Freq: Once | INTRAMUSCULAR | Status: DC | PRN

## 2014-11-19 MED ORDER — OXYTOCIN 10 UNIT/ML IJ SOLN
40.0000 [IU] | INTRAVENOUS | Status: DC | PRN
  Administered 2014-11-19: 40 [IU] via INTRAVENOUS

## 2014-11-19 MED ORDER — FENTANYL CITRATE (PF) 100 MCG/2ML IJ SOLN
25.0000 ug | INTRAMUSCULAR | Status: DC | PRN
  Administered 2014-11-19 (×2): 50 ug via INTRAVENOUS

## 2014-11-19 MED ORDER — KETOROLAC TROMETHAMINE 30 MG/ML IJ SOLN
30.0000 mg | Freq: Four times a day (QID) | INTRAMUSCULAR | Status: DC | PRN
  Administered 2014-11-19: 30 mg via INTRAMUSCULAR

## 2014-11-19 MED ORDER — OXYCODONE-ACETAMINOPHEN 5-325 MG PO TABS
2.0000 | ORAL_TABLET | ORAL | Status: DC | PRN
  Administered 2014-11-20: 2 via ORAL
  Filled 2014-11-19: qty 2

## 2014-11-19 MED ORDER — SIMETHICONE 80 MG PO CHEW
80.0000 mg | CHEWABLE_TABLET | Freq: Three times a day (TID) | ORAL | Status: DC
  Administered 2014-11-20: 80 mg via ORAL
  Filled 2014-11-19 (×2): qty 1

## 2014-11-19 MED ORDER — FENTANYL CITRATE (PF) 100 MCG/2ML IJ SOLN
INTRAMUSCULAR | Status: AC
  Filled 2014-11-19: qty 4

## 2014-11-19 MED ORDER — WITCH HAZEL-GLYCERIN EX PADS: 1.0000 "application " | MEDICATED_PAD | CUTANEOUS | Status: DC | PRN

## 2014-11-19 MED ORDER — KETOROLAC TROMETHAMINE 30 MG/ML IJ SOLN
INTRAMUSCULAR | Status: AC
  Administered 2014-11-21: 30 mg via INTRAVENOUS
  Filled 2014-11-19: qty 1

## 2014-11-19 MED ORDER — PHENYLEPHRINE 8 MG IN D5W 100 ML (0.08MG/ML) PREMIX OPTIME
INJECTION | INTRAVENOUS | Status: AC
  Filled 2014-11-19: qty 100

## 2014-11-19 MED ORDER — BUPIVACAINE IN DEXTROSE 0.75-8.25 % IT SOLN
INTRATHECAL | Status: AC
  Filled 2014-11-19: qty 2

## 2014-11-19 MED ORDER — LIDOCAINE-EPINEPHRINE (PF) 2 %-1:200000 IJ SOLN
INTRAMUSCULAR | Status: AC
  Filled 2014-11-19: qty 20

## 2014-11-19 MED ORDER — CEFAZOLIN SODIUM-DEXTROSE 2-3 GM-% IV SOLR
INTRAVENOUS | Status: AC
  Filled 2014-11-19: qty 50

## 2014-11-19 MED ORDER — DIPHENHYDRAMINE HCL 50 MG/ML IJ SOLN: 12.5000 mg | INTRAMUSCULAR | Status: DC | PRN

## 2014-11-19 MED ORDER — SODIUM BICARBONATE 8.4 % IV SOLN
INTRAVENOUS | Status: DC | PRN
  Administered 2014-11-19: 5 mL via EPIDURAL
  Administered 2014-11-19: 3 mL via EPIDURAL

## 2014-11-19 MED ORDER — OXYTOCIN 10 UNIT/ML IJ SOLN
INTRAMUSCULAR | Status: AC
  Filled 2014-11-19: qty 4

## 2014-11-19 MED ORDER — SIMETHICONE 80 MG PO CHEW
80.0000 mg | CHEWABLE_TABLET | ORAL | Status: DC
  Administered 2014-11-20 (×2): 80 mg via ORAL
  Filled 2014-11-19 (×2): qty 1

## 2014-11-19 MED ORDER — LACTATED RINGERS IV SOLN
INTRAVENOUS | Status: DC
  Administered 2014-11-22 – 2014-11-23 (×3): via INTRAVENOUS

## 2014-11-19 SURGICAL SUPPLY — 48 items
APL SKNCLS STERI-STRIP NONHPOA (GAUZE/BANDAGES/DRESSINGS) ×2
BENZOIN TINCTURE PRP APPL 2/3 (GAUZE/BANDAGES/DRESSINGS) ×4 IMPLANT
CLAMP CORD UMBIL (MISCELLANEOUS) IMPLANT
CLOSURE WOUND 1/2 X4 (GAUZE/BANDAGES/DRESSINGS) ×1
CLOTH BEACON ORANGE TIMEOUT ST (SAFETY) ×4 IMPLANT
CONTAINER PREFILL 10% NBF 15ML (MISCELLANEOUS) IMPLANT
DRAPE SHEET LG 3/4 BI-LAMINATE (DRAPES) IMPLANT
DRSG OPSITE POSTOP 4X10 (GAUZE/BANDAGES/DRESSINGS) ×4 IMPLANT
DURAPREP 26ML APPLICATOR (WOUND CARE) ×4 IMPLANT
ELECT REM PT RETURN 9FT ADLT (ELECTROSURGICAL) ×4
ELECTRODE REM PT RTRN 9FT ADLT (ELECTROSURGICAL) ×2 IMPLANT
EXTRACTOR VACUUM M CUP 4 TUBE (SUCTIONS) IMPLANT
EXTRACTOR VACUUM M CUP 4' TUBE (SUCTIONS)
GLOVE BIO SURGEON STRL SZ7.5 (GLOVE) ×4 IMPLANT
GLOVE BIOGEL PI IND STRL 7.5 (GLOVE) ×2 IMPLANT
GLOVE BIOGEL PI INDICATOR 7.5 (GLOVE) ×2
GOWN STRL REUS W/TWL LRG LVL3 (GOWN DISPOSABLE) ×8 IMPLANT
HEMOSTAT SURGICEL 2X14 (HEMOSTASIS) ×2 IMPLANT
KIT ABG SYR 3ML LUER SLIP (SYRINGE) IMPLANT
NDL HYPO 25X5/8 SAFETYGLIDE (NEEDLE) IMPLANT
NDL SAFETY ECLIPSE 18X1.5 (NEEDLE) ×2 IMPLANT
NEEDLE HYPO 18GX1.5 SHARP (NEEDLE) ×4
NEEDLE HYPO 25X5/8 SAFETYGLIDE (NEEDLE) IMPLANT
NS IRRIG 1000ML POUR BTL (IV SOLUTION) ×4 IMPLANT
PACK C SECTION WH (CUSTOM PROCEDURE TRAY) ×4 IMPLANT
PAD ABD 8X7 1/2 STERILE (GAUZE/BANDAGES/DRESSINGS) ×2 IMPLANT
PAD OB MATERNITY 4.3X12.25 (PERSONAL CARE ITEMS) ×4 IMPLANT
PENCIL SMOKE EVAC W/HOLSTER (ELECTROSURGICAL) ×4 IMPLANT
RTRCTR C-SECT PINK 25CM LRG (MISCELLANEOUS) ×4 IMPLANT
SPONGE GAUZE 4X4 12PLY STER LF (GAUZE/BANDAGES/DRESSINGS) ×2 IMPLANT
SPONGE LAP 18X18 X RAY DECT (DISPOSABLE) ×2 IMPLANT
STRIP CLOSURE SKIN 1/2X4 (GAUZE/BANDAGES/DRESSINGS) ×3 IMPLANT
SUT CHROMIC 2 0 CT 1 (SUTURE) ×4 IMPLANT
SUT MNCRL AB 3-0 PS2 27 (SUTURE) ×4 IMPLANT
SUT PLAIN 0 NONE (SUTURE) ×2 IMPLANT
SUT PLAIN 2 0 XLH (SUTURE) ×4 IMPLANT
SUT VIC AB 0 CT1 36 (SUTURE) ×4 IMPLANT
SUT VIC AB 0 CTX 36 (SUTURE) ×12
SUT VIC AB 0 CTX36XBRD ANBCTRL (SUTURE) ×6 IMPLANT
SUT VIC AB 2-0 SH 27 (SUTURE) ×8
SUT VIC AB 2-0 SH 27XBRD (SUTURE) IMPLANT
SUT VIC AB 3-0 SH 27 (SUTURE) ×8
SUT VIC AB 3-0 SH 27X BRD (SUTURE) IMPLANT
SYR BULB 3OZ (MISCELLANEOUS) ×4 IMPLANT
SYRINGE 10CC LL (SYRINGE) ×4 IMPLANT
TAPE CLOTH SURG 4X10 WHT LF (GAUZE/BANDAGES/DRESSINGS) ×2 IMPLANT
TOWEL OR 17X24 6PK STRL BLUE (TOWEL DISPOSABLE) ×4 IMPLANT
TRAY FOLEY CATH SILVER 14FR (SET/KITS/TRAYS/PACK) ×4 IMPLANT

## 2014-11-19 NOTE — Op Note (Addendum)
Cesarean Section Procedure Note  Indications: 37 4/7 wks with Twins (Dr. Claudean Severance via Dr. Sherrie George agree with 37-38wk delivery) scheduled for repeat c-section and sterilization via bilateral salpingectomy  Pre-operative Diagnosis: 1.37 4/7 2.Twins 3.h/o C-Sections 4.Desires Sterilization   Post-operative Diagnosis: 1.37 4/7 2.Twins 3.h/o C-Sections 4.Desires Sterilization  Procedure: CESAREAN SECTION MULTI-GESTATIONAL  Surgeon: Osborn Coho, MD    Assistants: Gerrit Heck, CNM  Anesthesia: Epidural  Anesthesiologist: Mal Amabile, MD   Procedure Details  The patient was taken to the operating room after the risks, benefits, complications, treatment options, and expected outcomes were discussed with the patient.  The patient concurred with the proposed plan, giving informed consent which was signed and witnessed. The patient was taken to Operating Room C-Section Suite, identified as Rachael Wilson and the procedure verified as C-Section Delivery. A Time Out was held and the above information confirmed.  After induction of anesthesia by obtaining a surgical level via the spinal, the patient was prepped and draped in the usual sterile manner. A Pfannenstiel skin incision was made and carried down through the subcutaneous tissue to the underlying layer of fascia.  The fascia was incised bilaterally and extended transversely bilaterally with the Mayo scissors. Kocher clamps were placed on the inferior aspect of the fascial incision and the underlying rectus muscle was separated from the fascia. The same was done on the superior aspect of the fascial incision.  The peritoneum was identified, entered bluntly and extended manually. An Alexis self-retaining retractor was placed.  The utero-vesical peritoneal reflection was incised transversely and the bladder flap was bluntly freed from the lower uterine segment. A low transverse uterine incision was made with the scalpel and extended bilaterally with  the bandage scissors.  Baby A was delivered via breech extraction without difficulty after rupture of membranes with clear fluid. After the umbilical cord was clamped x 1 and cut, the infant was handed to the awaiting pediatricians. Membranes ruptured clear flood and Baby B was converted to vertex and delivered.  The cord was clamped x 2 and cut and the infant handed to the waiting pediatricians.  Cord blood was obtained for evaluation.  The placentas were removed intact and appeared to be within normal limits. The uterus was cleared of all clots and debris. The uterine incision was closed with running interlocking sutures of 0 Vicryl and a second imbricating layer was performed as well.   Area inferior to incision was bleeding along bladder flap and was sutured with 3-0 vicryl.  Hemostasis was obtained and surgicel placed.  Copious irrigation was performed until clear.  Bilateral tubes and ovaries appeared to be within normal limits.  The right tube was grasped with a babcock and in a sequential fashion was clamped cut and suture ligated with 2-0 vicryl until the isthmic portion remained.  The remaining pedicle was clamped with a kelly clamp and suture ligated with 2-0 vicryl. The same was done on the contralateral side.  The pedicles were cauterized and surgicel was placed bilaterally.  Good hemostasis was noted.  The peritoneum was repaired with 2-0 chromic via a running suture.  The fascia was reapproximated with a running suture of 0 Vicryl. The subcutaneous tissue was reapproximated with 5 interrupted sutures of 2-0 plain.  The skin was reapproximated with a subcuticular suture of 3-0 monocryl.  Steristrips were applied with benzoin and pressure dressing placed.  Instrument, sponge, and needle counts were correct prior to abdominal closure and at the conclusion of the case.  The patient was  awaiting transfer to the recovery room in good condition.  Findings: Live female infants with Baby A Apgars 8 at one  minute and 9 at five minutes and Baby B with Apgars 8 at one minute and 9 at five minutes.  Normal appearing bilateral ovaries and fallopian tubes were noted.  Estimated Blood Loss:  1000 ml         Drains: foley to gravity 200 cc         Total IV Fluids: 2700 ml         Specimens to Pathology: Placenta and Bilateral Fallopian Tubes         Complications:  None; patient tolerated the procedure well.         Disposition: PACU - hemodynamically stable.         Condition: stable  Attending Attestation: I performed the procedure.

## 2014-11-19 NOTE — Anesthesia Postprocedure Evaluation (Signed)
  Anesthesia Post-op Note  Patient: Rachael Wilson  Procedure(s) Performed: Procedure(s): CESAREAN SECTION MULTI-GESTATIONAL (N/A) BILATERAL SALPINGECTOMY (Bilateral)  Patient Location: PACU  Anesthesia Type:Spinal  Level of Consciousness: awake, alert  and oriented  Airway and Oxygen Therapy: Patient Spontanous Breathing  Post-op Pain: none  Post-op Assessment: Post-op Vital signs reviewed, Patient's Cardiovascular Status Stable, Respiratory Function Stable, Patent Airway, No signs of Nausea or vomiting, Pain level controlled, No headache, No backache and Spinal receding         L Sensory Level: T10-Umbilical region R Sensory Level: T10-Umbilical region  Post-op Vital Signs: Reviewed and stable  Last Vitals:  Filed Vitals:   11/19/14 1800  BP: 108/61  Pulse: 93  Temp:   Resp: 24    Complications: No apparent anesthesia complications

## 2014-11-19 NOTE — Anesthesia Procedure Notes (Signed)
Spinal Patient location during procedure: OR Start time: 11/19/2014 3:06 PM Staffing Anesthesiologist: Mal Amabile Performed by: anesthesiologist  Preanesthetic Checklist Completed: patient identified, site marked, surgical consent, pre-op evaluation, timeout performed, IV checked, risks and benefits discussed and monitors and equipment checked Spinal Block Patient position: sitting Prep: site prepped and draped and DuraPrep Patient monitoring: cardiac monitor, continuous pulse ox, blood pressure and heart rate Approach: midline Location: L4-5 Injection technique: catheter Needle Needle type: Tuohy and Sprotte  Needle gauge: 24 G Needle length: 12.7 cm Catheter type: closed end flexible Catheter size: 19 g Assessment Sensory level: T4 Events: paresthesia Additional Notes Epidural performed then SAB through epidural needle. Transient paresthesia right leg. Epidural catheter threaded 5 cm into epidural space. Sterile dressing applied. Patient tolerated procedure well. Adequate sensory level.
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
## 2014-11-19 NOTE — Anesthesia Preprocedure Evaluation (Addendum)
Anesthesia Evaluation  Patient identified by MRN, date of birth, ID band Patient awake    Reviewed: Allergy & Precautions, NPO status , Patient's Chart, lab work & pertinent test results  Airway Mallampati: III  TM Distance: >3 FB Neck ROM: Full    Dental no notable dental hx. (+) Teeth Intact   Pulmonary sleep apnea ,  breath sounds clear to auscultation  Pulmonary exam normal       Cardiovascular hypertension, Normal cardiovascular examRhythm:Regular Rate:Normal     Neuro/Psych PSYCHIATRIC DISORDERS Depression negative neurological ROS     GI/Hepatic Neg liver ROS, GERD-  Medicated and Controlled,  Endo/Other  diabetes, Well Controlled, GestationalMorbid obesityPCOS  Renal/GU negative Renal ROS  negative genitourinary   Musculoskeletal negative musculoskeletal ROS (+)   Abdominal (+) + obese,   Peds  Hematology  (+) anemia , + ANA   Anesthesia Other Findings   Reproductive/Obstetrics (+) Pregnancy Twin gestation Discordant growth                            Anesthesia Physical Anesthesia Plan  ASA: III  Anesthesia Plan: Spinal   Post-op Pain Management:    Induction:   Airway Management Planned: Natural Airway  Additional Equipment:   Intra-op Plan:   Post-operative Plan:   Informed Consent: I have reviewed the patients History and Physical, chart, labs and discussed the procedure including the risks, benefits and alternatives for the proposed anesthesia with the patient or authorized representative who has indicated his/her understanding and acceptance.     Plan Discussed with: CRNA, Anesthesiologist and Surgeon  Anesthesia Plan Comments:         Anesthesia Quick Evaluation

## 2014-11-19 NOTE — Transfer of Care (Signed)
Immediate Anesthesia Transfer of Care Note  Patient: Rachael Wilson  Procedure(s) Performed: Procedure(s): CESAREAN SECTION MULTI-GESTATIONAL (N/A) BILATERAL SALPINGECTOMY (Bilateral)  Patient Location: PACU  Anesthesia Type:Spinal and Epidural  Level of Consciousness: awake, alert  and oriented  Airway & Oxygen Therapy: Patient Spontanous Breathing  Post-op Assessment: Report given to RN and Post -op Vital signs reviewed and stable  Post vital signs: Reviewed and stable  Last Vitals:  Filed Vitals:   11/19/14 1341  BP: 125/82  Pulse: 108  Temp: 37.8 C  Resp: 22    Complications: No apparent anesthesia complications

## 2014-11-20 ENCOUNTER — Encounter (HOSPITAL_COMMUNITY): Payer: Self-pay | Admitting: Obstetrics and Gynecology

## 2014-11-20 LAB — TYPE AND SCREEN
ABO/RH(D): A POS
Antibody Screen: NEGATIVE
Unit division: 0
Unit division: 0

## 2014-11-20 LAB — CBC
HCT: 29.1 % — ABNORMAL LOW (ref 36.0–46.0)
Hemoglobin: 8.9 g/dL — ABNORMAL LOW (ref 12.0–15.0)
MCH: 22.8 pg — ABNORMAL LOW (ref 26.0–34.0)
MCHC: 30.6 g/dL (ref 30.0–36.0)
MCV: 74.6 fL — ABNORMAL LOW (ref 78.0–100.0)
Platelets: 219 10*3/uL (ref 150–400)
RBC: 3.9 MIL/uL (ref 3.87–5.11)
RDW: 16.3 % — AB (ref 11.5–15.5)
WBC: 7.2 10*3/uL (ref 4.0–10.5)

## 2014-11-20 MED ORDER — SIMETHICONE 80 MG PO CHEW
80.0000 mg | CHEWABLE_TABLET | Freq: Three times a day (TID) | ORAL | Status: DC
  Administered 2014-11-22 – 2014-11-25 (×11): 80 mg via ORAL
  Filled 2014-11-20 (×11): qty 1

## 2014-11-20 MED ORDER — FERROUS SULFATE 325 (65 FE) MG PO TABS
325.0000 mg | ORAL_TABLET | Freq: Two times a day (BID) | ORAL | Status: DC
  Administered 2014-11-20: 325 mg via ORAL
  Filled 2014-11-20 (×2): qty 1

## 2014-11-20 MED ORDER — HYDROMORPHONE HCL 2 MG PO TABS
2.0000 mg | ORAL_TABLET | ORAL | Status: DC | PRN
  Administered 2014-11-20 – 2014-11-25 (×23): 2 mg via ORAL
  Filled 2014-11-20 (×23): qty 1

## 2014-11-20 NOTE — Lactation Note (Signed)
This note was copied from the chart of [name]. Lactation Consultation Note  Ex BF.  Breastfed youngest child for 2.5 years.  Others for 13 & 14 months.  Did do some supplementation w/ formula. Both babies are latched upon entering the room.  Sleepy at the breast. Answered mother's questions regarding having enough breastmilk to feed twins.  Encouraged mother. Mother alone in room.  Suggest she call family and FOB to have help in the room at all times.  Mother did call family. Helped mother w/ positioning both babies in football hold with support under heads using blanket roll. Discussed cluster feeding and feeding on demand.  Changed baby B diaper and relatched.  Burped baby A and relatched. Recommend mother breastfeed first before formula.  Reviewed supply and demand.  Provided mother w/ volume guidelines. Mom encouraged to feed baby 8-12 times/24 hours and with feeding cues.  Mom made aware of O/P services, breastfeeding support groups, community resources, and our phone # for post-discharge questions.    Patient Name: Rachael Wilson [id number] Date: 11/20/2014 Reason for consult: Initial assessment   Maternal Data Has patient been taught Hand Expression?: Yes Does the patient have breastfeeding experience prior to this delivery?: Yes  Feeding Feeding Type: Breast Fed Length of feed: 60 min (off and on)  LATCH Score/Interventions Latch: Grasps breast easily, tongue down, lips flanged, rhythmical sucking. (latched upon entering) Intervention(s): Assist with latch;Adjust position  Audible Swallowing: A few with stimulation Intervention(s): Hand expression  Type of Nipple: Everted at rest and after stimulation  Comfort (Breast/Nipple): Soft / non-tender     Hold (Positioning): Assistance needed to correctly position infant at breast and maintain latch.  LATCH Score: 8  Lactation Tools Discussed/Used     Consult Status Consult Status: Follow-up Date:  11/21/14 Follow-up type: In-patient    Dahlia Byes [facility] 11/20/2014, 9:03 AM

## 2014-11-20 NOTE — Progress Notes (Signed)
Pt complaining of abdominal pain. Upon assessment, abdomen distended and tight. Pt encouraged to walk, states that she still cannot pass gas. Pt refused to ambulate at this time. Will continue to monitor. Refused pain medication, pt states that she thinks that it is not touching her pain. Reinforced that walking and passing gas will help alleviate pain. Encouraged to drink prune/apple juice cocktail as well. Sherald Barge

## 2014-11-20 NOTE — Progress Notes (Signed)
UR chart review completed.  

## 2014-11-20 NOTE — Progress Notes (Signed)
Pt. Complaining of severe abdominal pressure/pain; has not voided in a few hours.  Encouraged patient to go void.  Pain was "the worst pain I have ever felt," per patient prior to voiding.  After voiding, pain is "much better."  It is not quite time for more pain medication yet; patient encouraged to walk in hallways since she is not passing flatus yet or had a bowel movement.  Will continue to monitor.  Vivi Martens RN

## 2014-11-20 NOTE — Addendum Note (Signed)
Addendum  created 11/20/14 7829 by Orlie Pollen, CRNA   Modules edited: Notes Section   Notes Section:  File: [id number]

## 2014-11-20 NOTE — Progress Notes (Signed)
Rachael Wilson [id number] Postpartum Day 1 S/P Scheduled repeat Cesarean Section  With BTL/ salpingectomy  Subjective: Patient remains in bed, denies syncope or dizziness. Reports consuming regular diet but had N/V last night. Has not eaten/ vomited today. Patient reports negative bowel movement and is not passing flatus.  Foley removed but denies getting up to urinate and reports bleeding is stable.   Patient is breastfeeding and reports going well.  Patient reports pain despite percocet. Patient expressing desire to ambulate to shower with RN assistance.   Objective: Temp:  [97.4 F (36.3 C)-100 F (37.8 C)] 97.9 F (36.6 C) (09/01 0627) Pulse Rate:  [71-108] 77 (09/01 0627) Resp:  [17-27] 18 (09/01 0627) BP: (99-125)/(46-83) 119/70 mmHg (09/01 0627) SpO2:  [92 %-100 %] 97 % (09/01 0627)   Recent Labs  11/18/14 1210 11/20/14 0558  HGB 9.6* 8.9*  HCT 30.8* 29.1*  WBC 6.3 7.2    Physical Exam:  General: alert and cooperative Mood/Affect: appropriate, affect bright Lungs: clear to auscultation, no wheezes, rales or rhonchi, symmetric air entry.  Heart: normal rate and regular rhythm. Breast: breasts appear normal, no suspicious masses, no skin or nipple changes or axillary nodes. Abdomen:  + bowel sounds, hypoactive, extensive distention and tenderness Incision: pressure dressing in place Uterine Fundus: firm, at U Lochia: appropriate Skin: normal coloration and turgor, no rashes, no suspicious skin lesions noted. DVT Evaluation: No evidence of DVT seen on physical exam. JP drain: none  Assessment Post Operative Day 1  S/P Repeat C/S with BTL/ sapingectomy normal [employer] BreastFeeding Asymptomatic Anemia -  Plan: -Orthostatic Vital Signs -Fe+ supplementation  BID -CBC in am to reassess -Encourage ambulation and shower -Change pain med from Percocet to Dilaudid -Encourage to eat a light meal -Reassess in evening -Continue other mgmt as ordered -Dr. AVS  to be updated on patient status  Ziad Maye LYNN MSN, CNM 11/20/2014, 10:01 AM

## 2014-11-20 NOTE — Anesthesia Postprocedure Evaluation (Signed)
  Anesthesia Post-op Note  Patient: Rachael Wilson  Procedure(s) Performed: Procedure(s): CESAREAN SECTION MULTI-GESTATIONAL (N/A) BILATERAL SALPINGECTOMY (Bilateral)  Patient Location: PACU and Mother/Baby  Anesthesia Type:Spinal  Level of Consciousness: awake, alert , oriented and patient cooperative  Airway and Oxygen Therapy: Patient Spontanous Breathing  Post-op Pain: none  Post-op Assessment: Post-op Vital signs reviewed, Patient's Cardiovascular Status Stable, Respiratory Function Stable, Patent Airway, No signs of Nausea or vomiting, Adequate PO intake and Pain level controlled LLE Motor Response: Purposeful movement   RLE Motor Response: Purposeful movement   L Sensory Level: L5-Outer lower leg, top of foot, great toe R Sensory Level: L5-Outer lower leg, top of foot, great toe  Post-op Vital Signs: Reviewed and stable  Last Vitals:  Filed Vitals:   11/20/14 0627  BP: 119/70  Pulse: 77  Temp: 36.6 C  Resp: 18    Complications: No apparent anesthesia complications

## 2014-11-21 ENCOUNTER — Inpatient Hospital Stay (HOSPITAL_COMMUNITY): Payer: Medicaid Other

## 2014-11-21 LAB — CBC
HCT: 28.5 % — ABNORMAL LOW (ref 36.0–46.0)
Hemoglobin: 8.8 g/dL — ABNORMAL LOW (ref 12.0–15.0)
MCH: 23 pg — ABNORMAL LOW (ref 26.0–34.0)
MCHC: 30.9 g/dL (ref 30.0–36.0)
MCV: 74.4 fL — AB (ref 78.0–100.0)
PLATELETS: 238 10*3/uL (ref 150–400)
RBC: 3.83 MIL/uL — ABNORMAL LOW (ref 3.87–5.11)
RDW: 16.5 % — AB (ref 11.5–15.5)
WBC: 9.8 10*3/uL (ref 4.0–10.5)

## 2014-11-21 MED ORDER — METOCLOPRAMIDE HCL 5 MG/ML IJ SOLN
10.0000 mg | Freq: Four times a day (QID) | INTRAMUSCULAR | Status: DC
  Administered 2014-11-21 – 2014-11-23 (×10): 10 mg via INTRAVENOUS
  Filled 2014-11-21 (×9): qty 2

## 2014-11-21 MED ORDER — LIDOCAINE 5 % EX PTCH
1.0000 | MEDICATED_PATCH | CUTANEOUS | Status: DC
  Administered 2014-11-21 – 2014-11-24 (×4): 1 via TRANSDERMAL
  Filled 2014-11-21 (×6): qty 1

## 2014-11-21 MED ORDER — KETOROLAC TROMETHAMINE 30 MG/ML IJ SOLN
30.0000 mg | Freq: Four times a day (QID) | INTRAMUSCULAR | Status: DC | PRN
  Administered 2014-11-21 – 2014-11-23 (×7): 30 mg via INTRAVENOUS
  Filled 2014-11-21 (×7): qty 1

## 2014-11-21 MED ORDER — FLEET ENEMA 7-19 GM/118ML RE ENEM
1.0000 | ENEMA | Freq: Once | RECTAL | Status: AC
  Administered 2014-11-21: 1 via RECTAL

## 2014-11-21 MED ORDER — LACTATED RINGERS IV SOLN
INTRAVENOUS | Status: DC
  Administered 2014-11-21 – 2014-11-22 (×3): via INTRAVENOUS

## 2014-11-21 NOTE — Progress Notes (Signed)
Patient ID: Rachael Wilson, female   DOB: 1981/01/20, 34 y.o.   MRN: [id number] Pt states she has too much incisional pain to ambulate.  She also does not want to take narcotics. Will give toradol and lidoderm patches and encourage ambulation

## 2014-11-21 NOTE — Progress Notes (Signed)
Pt requesting to see a provider from her OB office. Spoke with Sherre Scarlet, CNM about pt c/o abdominal pain. Explained abdominal assessment. Orders to change simethicone to every 8 hours per telephone with readback. CNM states that she will attempt to see her at some point this shift to discuss with patient. Sherald Barge

## 2014-11-21 NOTE — Progress Notes (Addendum)
Rachael Wilson [id number]  Subjective: Postpartum Day 2: Repeat C/S due to previous C/S x 3--now with significant abdominal distension. Denies syncope or dizziness with use of BS commode--using this due to significant pain with any distance of ambulation. No results from enema given at 0545--"only water came out". Feels nauseated, but no vomiting.  Feels can't care for twins due to pain and inability to walk without pain. Denies flatus, occasionally burps. Feeding:  Breast and supplementing due to babies' weight loss and her own discomfort. Contraceptive plan:  BTL with C/S.  Had no issues on post-op day 0--had more pain yesterday, no flatus reported, with gradual worsening of pain during the night.  Reports pain meds not helping.  Concerned about babies' weight loss and her lack of milk production.  No support persons at bedside--patient reports "they are coming today", were here yesterday.  Objective: Temp:  [97.9 F (36.6 C)-98.1 F (36.7 C)] 98.1 F (36.7 C) (09/02 0517) Pulse Rate:  [101-105] 101 (09/02 0517) Resp:  [18] 18 (09/02 0517) BP: (117-123)/(66-84) 117/84 mmHg (09/02 0517) SpO2:  [100 %] 100 % (09/02 0517)   Recent Labs  11/18/14 1210 11/20/14 0558 11/21/14 0545  HGB 9.6* 8.9* 8.8*  HCT 30.8* 29.1* 28.5*  WBC 6.3 7.2 9.8  Orthostatics stable yesterday.    Physical Exam:  General: fatigued and moderate distress Lochia: appropriate Uterine Fundus: firm Abdomen:  Not audible to me, but RN reports faint sounds noted on exam Incision: Honeycomb dressing CDI DVT Evaluation: No evidence of DVT seen on physical exam. Negative Homan's sign.   Assessment/Plan: Status post cesarean delivery, day 2--repeat x 4, twin gestation. Abdominal distension and pain Nausea Consulted with Dr. Normand Sloop NPO Abdominal xray IV LR at 125 cc/her Reglan 10 mg IV 1 6 hours. Will f/u with Dr. Normand Sloop when xray resulted. Support to patient for issues. Advised patient of need to  ambulate. Recommended she pump for stimulation, even if electing to supplement.  Nigel Bridgeman MSN, CNM 11/21/2014, 8:20 AM   Addendum: Returned from [place]:   CLINICAL DATA: Abdominal pain and distention following Cesarean section 2 days prior  EXAM: ABDOMEN - 1 VIEW  COMPARISON: None.  FINDINGS: There is moderate stool in the colon. There is mild generalized bowel dilatation. No air-fluid levels. No free air. Lung bases clear. There are small phleboliths in the pelvis.  IMPRESSION: Mild generalized bowel dilatation. No air-fluid levels. Suspect ileus as most likely etiology. No free air apparent on this supine examination.   Electronically Signed  By: Bretta Bang III M.D.  On: 11/21/2014 09:25  Impression: 2 days post-op repeat C/S x 4 Ileus  Plan: Consulted with Dr. Normand Sloop. Will continue NPO status, IV hydration, and Reglan IV q 6 hours. Babies to nursery. Patient to ambulate in hall q 2 hours. Understands if ileus persists, may need NG tube.    Nigel Bridgeman, CNM 11/21/14 11:20a

## 2014-11-21 NOTE — Progress Notes (Signed)
Patient crying in bed most of am.  C/o Abdominal pain.  Abdomen very distended.  Faints bowels sounds ausculated.  Nigel Bridgeman in earlier cxray done and IV started.  Patient given Reglan IV  Slept a little this am.  Patient encouraged to ambulate/

## 2014-11-21 NOTE — Progress Notes (Signed)
S: Went to see pt due to pain 10/10 from under her breasts to her upper thighs. Denies flatus. States has been unable to burp as well. Reports walking to the end of the hallway x 2 around noon yesterday, then once to the other end of the hallway around 6 pm. Now feels dizzy every time she gets OOB. States was feeling fine after her c-section, but started to feel rough yesterday. Reports scant lochia, not drinking very much water, but voiding q 2 hrs. States pain meds & Simethicone ineffective.  Tolerated chicken, broccoli and sweet potatoes yesterday.  O:  Today's Vitals   11/20/14 2120 11/20/14 2320 11/21/14 0315 11/21/14 0517  BP:    117/84  Pulse:    101  Temp:    98.1 F (36.7 C)  TempSrc:    Oral  Resp:    18  SpO2:    100%  PainSc: 6  8  10-Worst pain ever    I/O last 3 completed shifts: In: 3100 [I.V.:3100] Out: 4200 [Urine:3200; Blood:1000] Total I/O In: -  Out: 550 [Urine:550]  Gen: Crying, obvious discomfort Lungs: CTAB CV: RRR w/o M/R/G Abdomen: soft, very distended, generalized tenderness, guarding, absent bowel sounds in LLQ, hypoactive in other 3 quadrants Lochia: Scant Incision: Open to air, w/o redness, edema or drainage Ext: 1-2+ calf/ankle edema   A: 34 yo s/p repeat c-section w/ BTL/salpingectomy, POD #2 Gaseous distension Adequate urinary output   P: Simethicone around the clock - last dose given at 11:23 PM last evening. Contacted Dr. Stefano Gaul - suppository now (given at 5:45 AM), and allow a couple of hrs before next intervention. If no improvement s/p enema, plan for abdominal x-ray to r/o ileus. Plan is to also give IVFs and make NPO if needed.  Sherre Scarlet, CNM 11/21/14, 05:30 AM

## 2014-11-21 NOTE — Progress Notes (Signed)
Plan of care discussed with pt and pt's family. Ambulation schedule set; pt agrees to ambulate a lap in the hall at 1630 and 1815 with RN or NT. Complications of ilius discussed with pt and pt states she understands that it is imperative that she ambulates more frequently. Pt states that she will ambulate in room inbetween ambulation in halls.
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
## 2014-11-21 NOTE — Progress Notes (Signed)
Patient ambulated to bathroom with assistance of another nurse.  Tolerated poorly.  Patient crying and states she is in to much pain to walk in hall.  Patient assisted to chair.

## 2014-11-21 NOTE — Progress Notes (Signed)
Spoke with CNM Sherre Scarlet to inform her that since enema was administered, pt still denies flatus and has had no bm. Sherald Barge

## 2014-11-21 NOTE — Progress Notes (Signed)
Stedy to restroom. Pt states that she cannot stand. Requesting to see provider again. Pt crying when moving to sit or stand. Pt rates pain 10/10. Sherre Scarlet, CNM notified. Sherald Barge

## 2014-11-22 NOTE — Progress Notes (Signed)
Rachael Wilson [id number]  Subjective: Postpartum Day 3: Repeat C/S, scheduled for twins. Passing some flatus now, but still having pain with ambulation, making it difficult to care for her twins without assistance. Still ambulating infrequently. Using Dilaudid po for pain with benefit. Denies N/V. Feeding:  Breast Contraceptive plan:  BTL with salpingectomy  Family member spent night with patient, helped with infant care.  Objective: Temp:  [99.2 F (37.3 C)] 99.2 F (37.3 C) (09/02 1823) Pulse Rate:  [98-102] 98 (09/03 0725) Resp:  [18] 18 (09/03 0725) BP: (123-146)/(77-84) 123/77 mmHg (09/03 0725) SpO2:  [100 %] 100 % (09/02 1823)   Recent Labs  11/20/14 0558 11/21/14 0545  HGB 8.9* 8.8*  HCT 29.1* 28.5*  WBC 7.2 9.8    Physical Exam:  General: fatigued and mild distress with abdominal distension Lochia: appropriate Uterine Fundus: firm Abdomen:  Faint bowel sounds in LUQ, LLQ, and RLQ--absent in RUQ Incision: Honeycomb dressing CDI DVT Evaluation: No evidence of DVT seen on physical exam. Negative Homan's sign.  Lidoderm patch removed around 4am today--to be replaced at 4pm today.  Assessment/Plan: Status post cesarean delivery with BTL, day 3. Ileus--improving slightly Anemia--stable hemodynamically  Consulted with Dr. Normand Sloop Advance diet to clear liquids Continue encouragement for ambulation--pain med prn to facilitate ambulation and infant care abilities. Continue current care.   Nigel Bridgeman MSN, CNM 11/22/2014, 8:54 AM

## 2014-11-22 NOTE — Progress Notes (Signed)
RN in  In mother room and  plan of care discussed with mother for infants to go to nursery around 77 mn and stay till 5 30 am for mother to rest and sleep. Nursery agreeable with plan.mother to pump breast during night to encourage milk production

## 2014-11-22 NOTE — Lactation Note (Signed)
This note was copied from the chart of Rachael Rachael Wilson. Lactation Consultation Note  Mom is feeling somewhat better and is eager to get the baby's back to the breast.  Both babies latched and suckled with stimulation.  A few swallows were heard.  Plan is to continue BF them but to add pumping to aid in increasing milk volume.  Will request RN to set up double electric breast pump.  Patient Name: Rachael Wilson Today's Date: 11/22/2014 Reason for consult: Follow-up assessment   Maternal Data    Feeding Feeding Type: Breast Fed Length of feed: 10 min  LATCH Score/Interventions Latch: Repeated attempts needed to sustain latch, nipple held in mouth throughout feeding, stimulation needed to elicit sucking reflex.  Audible Swallowing: A few with stimulation  Type of Nipple: Everted at rest and after stimulation  Comfort (Breast/Nipple): Soft / non-tender     Hold (Positioning): Assistance needed to correctly position infant at breast and maintain latch.  LATCH Score: 7  Lactation Tools Discussed/Used     Consult Status Consult Status: Follow-up Date: 11/23/14 Follow-up type: In-patient    Rachael Wilson 11/22/2014, 5:02 PM    

## 2014-11-22 NOTE — Progress Notes (Signed)
rn IN ROOM AND PT ASSISTED TO SITTING AND THEN STANDING AND PT AMBULATED IN HALLWAY FULL LENGTH OF HALLWAY WITH SISTER. PT ASKING FOR PAIN MEDS, EXPLAINED TO EARLY FOR MEDS. PT ENCOURAGED TO AMBULATE  AGAIN 1 HOUR AFTER MEDS GIVEN THIS EVENING. PT TOLERATED WELL

## 2014-11-22 NOTE — Lactation Note (Signed)
This note was copied from the chart of Rachael Felicidad Zoeller. Lactation Consultation Note Mom has been in a lot of pain to abd, and hasn't felt like caring for twins, they have been in nursery some today. Support person not here. Mom has Dx; of illeus. Will cont. To f/u. Patient Name: Rachael Wilson Today's Date: 11/22/2014     Maternal Data    Feeding Feeding Type: Formula Nipple Type: Slow - flow  LATCH Score/Interventions                      Lactation Tools Discussed/Used     Consult Status      Wane Mollett G 11/22/2014, 2:56 PM    

## 2014-11-23 MED ORDER — METOCLOPRAMIDE HCL 10 MG PO TABS
10.0000 mg | ORAL_TABLET | Freq: Three times a day (TID) | ORAL | Status: DC
  Administered 2014-11-23 – 2014-11-25 (×6): 10 mg via ORAL
  Filled 2014-11-23 (×6): qty 1

## 2014-11-23 NOTE — Progress Notes (Addendum)
Rachael Wilson [id number] Postpartum Day 4 S/P Repeat Cesarean Section due to Patient Desire/Twin Gestation Ileus  Subjective: Patient up ad lib, denies syncope or dizziness. Reports consuming clear liquid diet without issues and denies N/V. Patient reports no bowel movement, but is passing flatus.  Denies issues with urination and reports bleeding is "good."  Patient is breast and bottlefeeding and reports going well.  Pain is being appropriately managed with use of Lidoderm patch and dilaudid.   Objective: Temp:  [98 F (36.7 C)-98.4 F (36.9 C)] 98 F (36.7 C) (09/04 0556) Pulse Rate:  [74-84] 74 (09/04 0556) Resp:  [14-18] 18 (09/04 0556) BP: (129-139)/(70-85) 129/70 mmHg (09/04 0556) SpO2:  [99 %] 99 % (09/03 1744)   Recent Labs  11/21/14 0545  HGB 8.8*  HCT 28.5*  WBC 9.8    Physical Exam:  General: alert, cooperative and mild distress Mood/Affect: Appropriate/Appropriate Lungs: clear to auscultation, no wheezes, rales or rhonchi, symmetric air entry.  Heart: normal rate and regular rhythm. Breast: breasts appear normal, no suspicious masses, no skin or nipple changes or axillary nodes. Abdomen:  + Faint, but hypoactive bowel sounds in BUQ, Tender, but soft. Mild Distention Incision: Honeycomb dressing in place Uterine Fundus: firm Lochia: appropriate Skin: Warm, Dry. DVT Evaluation: No evidence of DVT seen on physical exam. No cords or calf tenderness. No significant calf/ankle edema. JP drain:   None  Assessment Post Operative Day 4 S/P Repeat C/S Normal Involution Ileus BreastFeeding Hemodynamically Stable  Plan: -Encouraged continued ambulation -Continue pain medication as appropriate -Will return to reassess in early evening -If assessment improving will advance diet to soft, bland -Continue other mgmt as ordered -Dr. N.Loni Abdon to be updated on patient status  EMLY, JESSICA LYNN MSN, CNM 11/23/2014, 8:48 AM   Pt without nausea and vomitting Will  cap IVF Give all meds PO Advance diet as tolerated

## 2014-11-24 DIAGNOSIS — K567 Ileus, unspecified: Secondary | ICD-10-CM

## 2014-11-24 MED ORDER — BISACODYL 10 MG RE SUPP
10.0000 mg | Freq: Once | RECTAL | Status: AC
  Administered 2014-11-24: 10 mg via RECTAL
  Filled 2014-11-24: qty 1

## 2014-11-24 MED ORDER — DOCUSATE SODIUM 100 MG PO CAPS
100.0000 mg | ORAL_CAPSULE | Freq: Two times a day (BID) | ORAL | Status: DC
  Administered 2014-11-24 – 2014-11-25 (×3): 100 mg via ORAL
  Filled 2014-11-24 (×3): qty 1

## 2014-11-24 NOTE — Lactation Note (Addendum)
This note was copied from the chart of [name]. Lactation Consultation Note  Patient Name: Rachael Wilson WGNFA'O Date: 11/24/2014   Baby A "[name]" observed at the breast, but there was no evidence of milk transfer.  Mom has large nipples & [name] appears not to want a deep latch secondary to length of Mom's nipple.  Mom encouraged to increase volume of bottle feeds for [name]. Baby B "[name]," however, accomodates Mom's large nipples better & frequent swallows are heard when baby is at breast.   Both babies have a labial frenum that prevents ease of flanging of upper lip when breast or bottle feeding. Baby B's frenum is more prominent than A.  Mom has my # to call when ready to pump so I can do a flange check.  Patent pores noted on sides of nipples (Mom has a hx of nipple rings).  Mom on Reglan for ileus (Mom pleased about its side effect of increased prolactin levels).  [name] [facility] 11/24/2014, 10:28 AM

## 2014-11-24 NOTE — Progress Notes (Addendum)
Rachael Wilson [id number]  Subjective: Postpartum Day 5: Repeat LTC/S x 4--twin gestation Patient up ad lib, reports no syncope or dizziness.  Tolerating ambulation much better. Still has significant abdominal distension--ate graham cracker with peanut butter last night, had abdominal pain, but no vomiting. Good pain management with po Dilaudid, Ibuprophen, and Lidoderm patch. Feeding:  Breast Contraceptive plan:  BTL  Patient "really" wants to have a BM.  Has had Senakot nightly, and Mylicon TID.  No Colace.  Objective: Temp:  [98.4 F (36.9 C)-98.6 F (37 C)] 98.4 F (36.9 C) (09/05 0541) Pulse Rate:  [68-103] 103 (09/05 0541) Resp:  [16-20] 20 (09/05 0541) BP: (126-139)/(78-95) 126/78 mmHg (09/05 0541) SpO2:  [100 %] 100 % (09/04 1739)  No results for input(s): HGB, HCT, WBC in the last 72 hours.  Physical Exam:  General: alert Lochia: appropriate Uterine Fundus: firm Abdomen:  + bowel sounds in 3/4 quadrants (RLQ absent), tympanic abdominal distension noted. Incision: Honeycomb dressing CDI DVT Evaluation: No evidence of DVT seen on physical exam. Negative Homan's sign.   Assessment/Plan: Status post cesarean delivery #4, day 5--twins Ileus. Improving  Consulted with Dr. Normand Sloop Dulcolax suppository now, then soap suds enema if no results within 4 hours Advance to soft diet Colace BID. Continue current care.   Nigel Bridgeman MSN, CNM 11/24/2014, 10:45 AM   Will check PIH labs in the am. Pt still with occasional elevated blood pressure

## 2014-11-24 NOTE — Progress Notes (Signed)
Patient had normal BM approx 45 min after Dulcolax suppository (no need for SSE). Feeling better--had small amount of soft diet at lunch, still felt slightly nauseated, but no vomiting.  Will keep overnight to observe tolerance of soft diet, then d/c tomorrow. Will Rx Reglan, Dilaudid, Lidoderm patch, and Ibuprophen at d/c.  Nigel Bridgeman, CNM 11/24/14 4:33p
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
## 2014-11-24 NOTE — Discharge Instructions (Signed)
Postpartum Care After Cesarean Delivery After you deliver your newborn (postpartum period), the usual stay in the hospital is 24-72 hours. If there were problems with your labor or delivery, or if you have other medical problems, you might be in the hospital longer.  While you are in the hospital, you will receive help and instructions on how to care for yourself and your newborn during the postpartum period.  While you are in the hospital:  It is normal for you to have pain or discomfort from the incision in your abdomen. Be sure to tell your nurses when you are having pain, where the pain is located, and what makes the pain worse.  If you are breastfeeding, you may feel uncomfortable contractions of your uterus for a couple of weeks. This is normal. The contractions help your uterus get back to normal size.  It is normal to have some bleeding after delivery.  For the first 1-3 days after delivery, the flow is red and the amount may be similar to a period.  It is common for the flow to start and stop.  In the first few days, you may pass some small clots. Let your nurses know if you begin to pass large clots or your flow increases.  Do not  flush blood clots down the toilet before having the nurse look at them.  During the next 3-10 days after delivery, your flow should become more watery and pink or brown-tinged in color.  Ten to fourteen days after delivery, your flow should be a small amount of yellowish-white discharge.  The amount of your flow will decrease over the first few weeks after delivery. Your flow may stop in 6-8 weeks. Most women have had their flow stop by 12 weeks after delivery.  You should change your sanitary pads frequently.  Wash your hands thoroughly with soap and water for at least 20 seconds after changing pads, using the toilet, or before holding or feeding your newborn.  Your intravenous (IV) tubing will be removed when you are drinking enough fluids.  The  urine drainage tube (urinary catheter) that was inserted before delivery may be removed within 6-8 hours after delivery or when feeling returns to your legs. You should feel like you need to empty your bladder within the first 6-8 hours after the catheter has been removed.  In case you become weak, lightheaded, or faint, call your nurse before you get out of bed for the first time and before you take a shower for the first time.  Within the first few days after delivery, your breasts may begin to feel tender and full. This is called engorgement. Breast tenderness usually goes away within 48-72 hours after engorgement occurs. You may also notice milk leaking from your breasts. If you are not breastfeeding, do not stimulate your breasts. Breast stimulation can make your breasts produce more milk.  Spending as much time as possible with your newborn is very important. During this time, you and your newborn can feel close and get to know each other. Having your newborn stay in your room (rooming in) will help to strengthen the bond with your newborn. It will give you time to get to know your newborn and become comfortable caring for your newborn.  Your hormones change after delivery. Sometimes the hormone changes can temporarily cause you to feel sad or tearful. These feelings should not last more than a few days. If these feelings last longer than that, you should talk to your  caregiver.  If desired, talk to your caregiver about methods of family planning or contraception.  Talk to your caregiver about immunizations. Your caregiver may want you to have the following immunizations before leaving the hospital:  Tetanus, diphtheria, and pertussis (Tdap) or tetanus and diphtheria (Td) immunization. It is very important that you and your family (including grandparents) or others caring for your newborn are up-to-date with the Tdap or Td immunizations. The Tdap or Td immunization can help protect your newborn  from getting ill.  Rubella immunization.  Varicella (chickenpox) immunization.  Influenza immunization. You should receive this annual immunization if you did not receive the immunization during your pregnancy. Document Released: 11/30/2011 Document Reviewed: 11/30/2011 [facility] Patient Information 2015 [place], [place]. This information is not intended to replace advice given to you by your health care provider. Make sure you discuss any questions you have with your health care provider.  Ileus The intestine (bowel, or gut) is a long, muscular tube connecting your stomach to your rectum. If the intestine stops working, food cannot pass through. This is called an ileus. This can happen for a variety of reasons. Ileus is a major medical problem that usually requires hospitalization. If your intestine stops working because of a blockage, this is called a bowel obstruction and is a different condition. CAUSES   Surgery in your abdomen. This can last from a few hours to a few days.  An infection or inflammation in the belly (abdomen). This includes inflammation of the lining of the abdomen (peritonitis).  Infection or inflammation in other parts of the body, such as pneumonia or pancreatitis.  Passage of gallstones or kidney stones.  Damage to the nerves or blood vessels which go to the bowel.  Imbalance in the salts in the blood (electrolytes).  Injury to the brain and/or spinal cord.  Medications. Many medications can cause ileus or make it worse. The most common of these are strong pain medications. SYMPTOMS  Symptoms of bowel obstruction come from the bowel inactivity. They may include:  Bloating. Your belly gets bigger (distension).  Pain or discomfort in the abdomen.  Poor appetite, feeling sick to your stomach (nausea), and vomiting.  You may also not be able to hear your normal bowel sounds, such as "growling" in your stomach. DIAGNOSIS   Your history and a physical exam  will usually suggest to your caregiver that you have an ileus.  X-rays or a CT scan of your abdomen will confirm the diagnosis. X-rays, CT scans, and lab tests may also suggest the cause. TREATMENT   Rest the intestine until it starts working again. This is most often accomplished by:  Stopping intake of oral food and drink. Dehydration is prevented by using IV (intravenous) fluids.  Sometimes, a nasogastric tube (NG tube) is needed. This is a narrow plastic tube inserted through your nose and into your stomach. It is connected to suction to keep the stomach emptied out. This also helps treat the nausea and vomiting.  If there is an imbalance in the electrolytes, they are corrected with supplements in your intravenous fluids.  Medications that might make an ileus worse might be stopped.  There are no medications that reliably treat ileus, though your caregiver may suggest a trial of certain medications.  If your condition is slow to resolve, you will be reevaluated to be sure another condition, such as a blockage, is not present. Ileus is common and usually has a good outcome. Depending on the cause of your ileus, it  usually can be treated by your caregivers with good results. Sometimes, specialists (surgeons or gastroenterologists) are asked to assist in your care.  HOME CARE INSTRUCTIONS   Follow your caregiver's instructions regarding diet and fluid intake. This will usually include drinking plenty of clear fluids, avoiding alcohol and caffeine, and eating a gentle diet.  Follow your caregiver's instructions regarding activity. A period of rest is sometimes advised before returning to work or school.  Take only medications prescribed by your caregiver. Be especially careful with narcotic pain medication, which can slow your bowel activity and contribute to ileus.  Keep any follow-up appointments with your caregiver or specialists. SEEK MEDICAL CARE IF:   You have a recurrence of  nausea, vomiting, or abdominal discomfort.  You develop fever of more than 102 F (38.9 C). SEEK IMMEDIATE MEDICAL CARE IF:   You have severe abdominal pain.  You are unable to keep fluids down. Document Released: 03/10/2003 Document Revised: 07/22/2013 Document Reviewed: 07/10/2008 [facility] Patient Information 2015 [place], [place]. This information is not intended to replace advice given to you by your health care provider. Make sure you discuss any questions you have with your health care provider.

## 2014-11-25 DIAGNOSIS — Z658 Other specified problems related to psychosocial circumstances: Secondary | ICD-10-CM

## 2014-11-25 LAB — CBC WITH DIFFERENTIAL/PLATELET
BASOS ABS: 0 10*3/uL (ref 0.0–0.1)
Basophils Relative: 0 % (ref 0–1)
EOS PCT: 3 % (ref 0–5)
Eosinophils Absolute: 0.2 10*3/uL (ref 0.0–0.7)
HCT: 26.7 % — ABNORMAL LOW (ref 36.0–46.0)
HEMOGLOBIN: 8.3 g/dL — AB (ref 12.0–15.0)
LYMPHS ABS: 1.8 10*3/uL (ref 0.7–4.0)
LYMPHS PCT: 26 % (ref 12–46)
MCH: 23.1 pg — AB (ref 26.0–34.0)
MCHC: 31.1 g/dL (ref 30.0–36.0)
MCV: 74.2 fL — AB (ref 78.0–100.0)
Monocytes Absolute: 0.6 10*3/uL (ref 0.1–1.0)
Monocytes Relative: 10 % (ref 3–12)
NEUTROS ABS: 4.1 10*3/uL (ref 1.7–7.7)
NEUTROS PCT: 61 % (ref 43–77)
PLATELETS: 276 10*3/uL (ref 150–400)
RBC: 3.6 MIL/uL — AB (ref 3.87–5.11)
RDW: 16.7 % — ABNORMAL HIGH (ref 11.5–15.5)
WBC: 6.7 10*3/uL (ref 4.0–10.5)

## 2014-11-25 LAB — COMPREHENSIVE METABOLIC PANEL
ALT: 27 U/L (ref 14–54)
ANION GAP: 9 (ref 5–15)
AST: 24 U/L (ref 15–41)
Albumin: 2.4 g/dL — ABNORMAL LOW (ref 3.5–5.0)
Alkaline Phosphatase: 96 U/L (ref 38–126)
BUN: 6 mg/dL (ref 6–20)
CHLORIDE: 108 mmol/L (ref 101–111)
CO2: 22 mmol/L (ref 22–32)
CREATININE: 0.69 mg/dL (ref 0.44–1.00)
Calcium: 8.5 mg/dL — ABNORMAL LOW (ref 8.9–10.3)
Glucose, Bld: 79 mg/dL (ref 65–99)
POTASSIUM: 3.9 mmol/L (ref 3.5–5.1)
SODIUM: 139 mmol/L (ref 135–145)
Total Bilirubin: 0.4 mg/dL (ref 0.3–1.2)
Total Protein: 5.3 g/dL — ABNORMAL LOW (ref 6.5–8.1)

## 2014-11-25 LAB — URIC ACID: Uric Acid, Serum: 4.7 mg/dL (ref 2.3–6.6)

## 2014-11-25 LAB — LACTATE DEHYDROGENASE: LDH: 217 U/L — AB (ref 98–192)

## 2014-11-25 MED ORDER — HYDROMORPHONE HCL 2 MG PO TABS: 2.0000 mg | ORAL_TABLET | ORAL | Status: DC | PRN

## 2014-11-25 MED ORDER — LIDOCAINE 5 % EX PTCH: 1.0000 | MEDICATED_PATCH | CUTANEOUS | Status: DC

## 2014-11-25 MED ORDER — IBUPROFEN 600 MG PO TABS: 600.0000 mg | ORAL_TABLET | Freq: Four times a day (QID) | ORAL | Status: DC | PRN

## 2014-11-25 NOTE — Lactation Note (Signed)
This note was copied from the chart of Rachael Cayli Tory. Lactation Consultation Note  Both babies latched in football hold upon entering at the end of feeding. Faxed pump referral to [facility].  Baby's stools have transitioned. Plan is that mother will breastfeed, then supplement w/ formula and post pump w/ DEBP. She will give babies pumped breastmilk at next feeding. Encouraged mother to call if she needs further assistance.     Patient Name: Rachael Wilson Today's Date: 11/25/2014 Reason for consult: Follow-up assessment   Maternal Data    Feeding Feeding Type: Breast Fed Length of feed: 20 min  LATCH Score/Interventions                      Lactation Tools Discussed/Used     Consult Status Consult Status: Complete    Yanel Dombrosky Boschen 11/25/2014, 9:05 AM    

## 2015-09-09 ENCOUNTER — Encounter (HOSPITAL_COMMUNITY): Payer: Self-pay | Admitting: Emergency Medicine

## 2015-09-09 ENCOUNTER — Emergency Department (HOSPITAL_COMMUNITY)
Admission: EM | Admit: 2015-09-09 | Discharge: 2015-09-09 | Disposition: A | Discharge status: Home / Self Care | Payer: Medicaid Other | Attending: Emergency Medicine | Admitting: Emergency Medicine

## 2015-09-09 DIAGNOSIS — H109 Unspecified conjunctivitis: Secondary | ICD-10-CM | POA: Insufficient documentation

## 2015-09-09 MED ORDER — ERYTHROMYCIN 5 MG/GM OP OINT
TOPICAL_OINTMENT | Freq: Once | OPHTHALMIC | Status: AC
  Administered 2015-09-09: 03:00:00 via OPHTHALMIC
  Filled 2015-09-09: qty 3.5

## 2015-09-09 NOTE — ED Provider Notes (Signed)
CSN: [id number]     Arrival date & time 09/09/15  0027 History   First MD Initiated Contact with Patient 09/09/15 0220     Chief Complaint  Patient presents with  . Conjunctivitis     (Consider location/radiation/quality/duration/timing/severity/associated sxs/prior Treatment) HPI Comments: This a normally healthy 35 year old female who presents with left eye swelling, discharge, redness and crusting of the margins that is now starting in the right eye.  She does have a 5-month-old child who was diagnosed with conjunctivitis.  4 days ago.  Has not tried any therapies  Patient is a 35 y.o. female presenting with conjunctivitis. The history is provided by the patient.  Conjunctivitis This is a new problem. The current episode started in the past 7 days. The problem occurs constantly. The problem has been gradually worsening. Pertinent negatives include no chills, fever or headaches. Nothing aggravates the symptoms. She has tried nothing for the symptoms. The treatment provided no relief.    Past Medical History  Diagnosis Date  . Polycystic ovarian syndrome   . IBS (irritable bowel syndrome)   . Pregnancy induced hypertension     with G1  . Gestational diabetes     diet controlled  . Sleep apnea     h/o sleep apnea when overweight- no CPAP   Past Surgical History  Procedure Laterality Date  . Cesarean section      x 3  . Dilation and curettage of uterus    . Cesarean section  08/13/2011    Procedure: CESAREAN SECTION;  Surgeon: Purcell Nails, MD;  Location: [facility];  Service: Gynecology;  Laterality: N/A;  Repeat cesarean section with delivery of baby girl at 29.  Apgars 8/8/9.  Marland Kitchen Cesarean section multi-gestational N/A 11/19/2014    Procedure: CESAREAN SECTION MULTI-GESTATIONAL;  Surgeon: Osborn Coho, MD;  Location: [facility];  Service: Obstetrics;  Laterality: N/A;  . Bilateral salpingectomy Bilateral 11/19/2014    Procedure: BILATERAL SALPINGECTOMY;  Surgeon: Osborn Coho,  MD;  Location: [facility];  Service: Obstetrics;  Laterality: Bilateral;   Family History  Problem Relation Age of Onset  . Diabetes Mother   . Diabetes Maternal Grandmother   . Cancer Maternal Grandmother   . Diabetes Maternal Grandfather   . Diabetes Paternal Grandmother   . Diabetes Paternal Grandfather   . Anesthesia problems Neg Hx   . Hypotension Neg Hx   . Malignant hyperthermia Neg Hx   . Pseudochol deficiency Neg Hx    Social History  Substance Use Topics  . Smoking status: Never Smoker   . Smokeless tobacco: Never Used  . Alcohol Use: No   OB History    Gravida Para Term Preterm AB TAB SAB Ectopic Multiple Living   0 0 1 3     Review of Systems  Constitutional: Negative for fever and chills.  Eyes: Positive for discharge and redness.  Neurological: Negative for dizziness and headaches.  All other systems reviewed and are negative.     Allergies  Bactrim and Sulfa antibiotics  Home Medications   Prior to Admission medications   Medication Sig Start Date End Date Taking? Authorizing Provider  ferrous sulfate 325 (65 FE) MG tablet Take 325 mg by mouth 2 (two) times daily.    Historical Provider, MD  HYDROmorphone (DILAUDID) 2 MG tablet Take 1 tablet (2 mg total) by mouth every 4 (four) hours as needed for severe pain. 11/25/14   Nigel Bridgeman, CNM  ibuprofen (ADVIL,MOTRIN) 600 MG tablet  Take 1 tablet (600 mg total) by mouth every 6 (six) hours as needed. 11/25/14   Nigel BridgemanVicki Latham, CNM  lidocaine (LIDODERM) 5 % Place 1 patch onto the skin daily. Remove & Discard patch within 12 hours or as directed by MD 11/25/14   Nigel BridgemanVicki Latham, CNM  Prenatal Vit-Fe Fumarate-FA (PRENATAL MULTIVITAMIN) TABS tablet Take 1 tablet by mouth at bedtime.     Historical Provider, MD   BP 146/97 mmHg  Pulse 73  Temp(Src) 97.6 F (36.4 C) (Oral)  Resp 18  SpO2 100%  LMP 03/03/2014  Breastfeeding? Yes Physical Exam  Constitutional: She is oriented to person, place, and time. She  appears well-developed and well-nourished.  HENT:  Head: Normocephalic.  Eyes: EOM are normal. Pupils are equal, round, and reactive to light. Right eye exhibits discharge. Left eye exhibits discharge. Right conjunctiva is injected. Left conjunctiva is injected.  Neck: Normal range of motion.  Cardiovascular: Normal rate.   Pulmonary/Chest: Effort normal.  Musculoskeletal: Normal range of motion.  Neurological: She is alert and oriented to person, place, and time.  Nursing note and vitals reviewed.   ED Course  Procedures (including critical care time) Labs Review Labs Reviewed - No data to display  Imaging Review No results found. I have personally reviewed and evaluated these images and lab results as part of my medical decision-making.   EKG Interpretation None     Patient's youngest daughter [id number]-month-old is conjunctivitis as well that is not being responsive to oral antibiotics as well as topical, I feel that this patient will benefit from erythromycin ointment 4 times a day MDM   Final diagnoses:  Bilateral conjunctivitis         Earley FavorGail Brizeida Mcmurry, NP 09/09/15 [id number]  Derwood KaplanAnkit Nanavati, MD 09/09/15 2307

## 2015-09-09 NOTE — Discharge Instructions (Signed)
Use the supplied ointment 4 times a day  How to Use Eye Drops and Eye Ointments HOW TO APPLY EYE DROPS Follow these steps when applying eye drops:  Wash your hands.  Tilt your head back.  Put a finger under your eye and use it to gently pull your lower lid downward. Keep that finger in place.  Using your other hand, hold the dropper between your thumb and index finger.  Position the dropper just over the edge of the lower lid. Hold it as close to your eye as you can without touching the dropper to your eye.  Steady your hand. One way to do this is to lean your index finger against your brow.  Look up.  Slowly and gently squeeze one drop of medicine into your eye.  Close your eye.  Place a finger between your lower eyelid and your nose. Press gently for 2 minutes. This increases the amount of time that the medicine is exposed to the eye. It also reduces side effects that can develop if the drop gets into the bloodstream through the nose. HOW TO APPLY EYE OINTMENTS Follow these steps when applying eye ointments:  Wash your hands.  Put a finger under your eye and use it to gently pull your lower lid downward. Keep that finger in place.  Using your other hand, place the tip of the tube between your thumb and index finger with the remaining fingers braced against your cheek or nose.  Hold the tube just over the edge of your lower lid without touching the tube to your lid or eyeball.  Look up.  Line the inner part of your lower lid with ointment.  Gently pull up on your upper lid and look down. This will force the ointment to spread over the surface of the eye.  Release the upper lid.  If you can, close your eyes for 1-2 minutes. Do not rub your eyes. If you applied the ointment correctly, your vision will be blurry for a few minutes. This is normal. ADDITIONAL INFORMATION  Make sure to use the eye drops or ointment as told by your health care provider.  If you have been  told to use both eye drops and an eye ointment, apply the eye drops first, then wait 3-4 minutes before you apply the ointment.  Try not to touch the tip of the dropper or tube to your eye. A dropper or tube that has touched the eye can become contaminated.   This information is not intended to replace advice given to you by your health care provider. Make sure you discuss any questions you have with your health care provider.   Document Released: 06/13/2000 Document Revised: 07/22/2014 Document Reviewed: 03/03/2014 Elsevier Interactive Patient Education 2016 [employer]  Bacterial Conjunctivitis Bacterial conjunctivitis (commonly called pink eye) is redness, soreness, or puffiness (inflammation) of the white part of your eye. It is caused by a germ called bacteria. These germs can easily spread from person to person (contagious). Your eye often will become red or pink. Your eye may also become irritated, watery, or have a thick discharge.  HOME CARE   Apply a cool, clean washcloth over closed eyelids. Do this for 10-20 minutes, 3-4 times a day while you have pain.  Gently wipe away any fluid coming from the eye with a warm, wet washcloth or cotton ball.  Wash your hands often with soap and water. Use paper towels to dry your hands.  Do not share towels  or washcloths.  Change or wash your pillowcase every day.  Do not use eye makeup until the infection is gone.  Do not use machines or drive if your vision is blurry.  Stop using contact lenses. Do not use them again until your doctor says it is okay.  Do not touch the tip of the eye drop bottle or medicine tube with your fingers when you put medicine on the eye. GET HELP RIGHT AWAY IF:   Your eye is not better after 3 days of starting your medicine.  You have a yellowish fluid coming out of the eye.  You have more pain in the eye.  Your eye redness is spreading.  Your vision becomes blurry.  You have a fever or lasting  symptoms for more than 2-3 days.  You have a fever and your symptoms suddenly get worse.  You have pain in the face.  Your face gets red or puffy (swollen). MAKE SURE YOU:   Understand these instructions.  Will watch this condition.  Will get help right away if you are not doing well or get worse.   This information is not intended to replace advice given to you by your health care provider. Make sure you discuss any questions you have with your health care provider.   Document Released: 12/15/2007 Document Revised: 02/22/2012 Document Reviewed: 11/11/2011 Elsevier Interactive Patient Education [employer]

## 2015-09-09 NOTE — ED Notes (Signed)
Pt reports her daughter (not present with her) has had pink eye and has been seen twice and medication is not working.  Now this pt is experiencing redness, swelling, and drainage of the left orbital area. Pt states she feels like it is spreading into her other eye. Other eye looks slightly red and irritated on assessment.

## 2016-04-15 ENCOUNTER — Encounter (HOSPITAL_COMMUNITY): Payer: Self-pay | Admitting: Emergency Medicine

## 2016-04-15 ENCOUNTER — Ambulatory Visit (HOSPITAL_COMMUNITY)
Admission: EM | Admit: 2016-04-15 | Discharge: 2016-04-15 | Disposition: A | Discharge status: Home / Self Care | Payer: Medicaid Other | Attending: Family Medicine | Admitting: Family Medicine

## 2016-04-15 DIAGNOSIS — J029 Acute pharyngitis, unspecified: Secondary | ICD-10-CM

## 2016-04-15 MED ORDER — AMOXICILLIN 875 MG PO TABS: 875.0000 mg | ORAL_TABLET | Freq: Two times a day (BID) | ORAL | 0 refills | Status: DC
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
## 2016-04-15 NOTE — Discharge Instructions (Signed)
Pick up some Afrin nasal spray(oxymetazoline) and use on one side of the nose or the other every night for the next several nights until the infection is resolving.

## 2016-04-15 NOTE — ED Triage Notes (Signed)
Here for ST onset 1 week associated w/dyphagia, prod cough, nasal congestion/drainage  Denies fevers  A&O x4... NAD

## 2016-06-08 ENCOUNTER — Encounter (HOSPITAL_COMMUNITY): Payer: Self-pay

## 2016-06-08 ENCOUNTER — Emergency Department (HOSPITAL_COMMUNITY)
Admission: EM | Admit: 2016-06-08 | Discharge: 2016-06-08 | Disposition: A | Discharge status: Home / Self Care | Payer: Medicaid Other | Attending: Emergency Medicine | Admitting: Emergency Medicine

## 2016-06-08 DIAGNOSIS — J111 Influenza due to unidentified influenza virus with other respiratory manifestations: Secondary | ICD-10-CM | POA: Diagnosis not present

## 2016-06-08 DIAGNOSIS — R69 Illness, unspecified: Secondary | ICD-10-CM

## 2016-06-08 DIAGNOSIS — R05 Cough: Secondary | ICD-10-CM | POA: Diagnosis present

## 2016-06-08 DIAGNOSIS — L02213 Cutaneous abscess of chest wall: Secondary | ICD-10-CM | POA: Insufficient documentation

## 2016-06-08 LAB — COMPREHENSIVE METABOLIC PANEL
ALK PHOS: 49 U/L (ref 38–126)
ALT: 26 U/L (ref 14–54)
AST: 23 U/L (ref 15–41)
Albumin: 4.1 g/dL (ref 3.5–5.0)
Anion gap: 5 (ref 5–15)
BUN: 8 mg/dL (ref 6–20)
CALCIUM: 8.9 mg/dL (ref 8.9–10.3)
CO2: 25 mmol/L (ref 22–32)
CREATININE: 0.95 mg/dL (ref 0.44–1.00)
Chloride: 106 mmol/L (ref 101–111)
GLUCOSE: 107 mg/dL — AB (ref 65–99)
Potassium: 4 mmol/L (ref 3.5–5.1)
SODIUM: 136 mmol/L (ref 135–145)
Total Bilirubin: 0.6 mg/dL (ref 0.3–1.2)
Total Protein: 8.2 g/dL — ABNORMAL HIGH (ref 6.5–8.1)

## 2016-06-08 LAB — CBC
HCT: 40.2 % (ref 36.0–46.0)
Hemoglobin: 13.2 g/dL (ref 12.0–15.0)
MCH: 28.2 pg (ref 26.0–34.0)
MCHC: 32.8 g/dL (ref 30.0–36.0)
MCV: 85.9 fL (ref 78.0–100.0)
PLATELETS: 235 10*3/uL (ref 150–400)
RBC: 4.68 MIL/uL (ref 3.87–5.11)
RDW: 13.7 % (ref 11.5–15.5)
WBC: 6.3 10*3/uL (ref 4.0–10.5)

## 2016-06-08 LAB — I-STAT BETA HCG BLOOD, ED (MC, WL, AP ONLY)

## 2016-06-08 LAB — LIPASE, BLOOD: LIPASE: 23 U/L (ref 11–51)

## 2016-06-08 MED ORDER — IBUPROFEN 800 MG PO TABS
800.0000 mg | ORAL_TABLET | Freq: Once | ORAL | Status: AC
  Administered 2016-06-08: 800 mg via ORAL
  Filled 2016-06-08: qty 1

## 2016-06-08 MED ORDER — DOXYCYCLINE HYCLATE 100 MG PO CAPS: 100.0000 mg | ORAL_CAPSULE | Freq: Two times a day (BID) | ORAL | 0 refills | Status: AC

## 2016-06-08 MED ORDER — NAPROXEN 500 MG PO TABS: 500.0000 mg | ORAL_TABLET | Freq: Two times a day (BID) | ORAL | 0 refills | Status: AC

## 2016-06-08 MED ORDER — OSELTAMIVIR PHOSPHATE 75 MG PO CAPS: 75.0000 mg | ORAL_CAPSULE | Freq: Two times a day (BID) | ORAL | 0 refills | Status: AC

## 2016-06-08 MED ORDER — LIDOCAINE HCL (PF) 1 % IJ SOLN
5.0000 mL | Freq: Once | INTRAMUSCULAR | Status: AC
  Administered 2016-06-08: 5 mL
  Filled 2016-06-08: qty 30

## 2016-06-08 NOTE — Discharge Instructions (Signed)
You were diagnosed with the a flu-like illness.  You will feel ill for as much as a few weeks.  Please take any prescribed medications as instructed, and you may use over-the-counter Tylenol and/or ibuprofen as needed according to label instructions (unless you have an allergy to either or have been told by your doctor not to take them).  Follow up with your physician as instructed above, and return to the Emergency Department (ED) if you are unable to tolerate fluids due to vomiting, have worsening trouble breathing, become extremely tired or difficult to awaken, or if you develop any other symptoms that concern you.
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
## 2016-06-08 NOTE — ED Triage Notes (Signed)
Pt c/o generalized body aches. n/v/d, headache, and chills x 3 days and increasing abscess on L chest x 1 week.  Pain score 8/10.  Pt reports taking OTC medications w/o relief.  Pt reports children were recent diagnosed w/ the flu.

## 2016-06-24 ENCOUNTER — Ambulatory Visit (HOSPITAL_COMMUNITY)
Admission: EM | Admit: 2016-06-24 | Discharge: 2016-06-24 | Disposition: A | Discharge status: Home / Self Care | Payer: Medicaid Other | Attending: Internal Medicine | Admitting: Internal Medicine

## 2016-06-24 ENCOUNTER — Encounter (HOSPITAL_COMMUNITY): Payer: Self-pay | Admitting: Emergency Medicine

## 2016-06-24 DIAGNOSIS — J309 Allergic rhinitis, unspecified: Secondary | ICD-10-CM | POA: Diagnosis not present

## 2016-06-24 DIAGNOSIS — J301 Allergic rhinitis due to pollen: Secondary | ICD-10-CM | POA: Diagnosis not present

## 2016-06-24 NOTE — Discharge Instructions (Signed)
Drink plenty of fluids and stay well-hydrated. Use copious amounts of saline nasal spray. May use steam to help with opening your sinuses or use a Netty pot. There is no evidence of sinus infection. This was discussed in detail during the visit. Sudafed PE 10 mg every 4 to 6 hours as needed for congestion Allegra or Zyrtec daily as needed for drainage and runny nose. For stronger antihistamine may take Chlor-Trimeton 2 to 4 mg every 4 to 6 hours, may cause drowsiness. Saline nasal spray used frequently. Ibuprofen 600 mg every 6 hours as needed for pain, discomfort or fever. Drink plenty of fluids and stay well-hydrated. Flonase or Rhinocort nasal spray daily

## 2016-06-24 NOTE — ED Notes (Addendum)
Patient and I had in depth conversation about discharge instructions. Patient stated that she had tried everything suggested on discharge instructions at home. Patient and I discussed her frustration with provider not ordering antibiotic and the reasoning behind his decision. RN attempted service recovery and education.

## 2016-06-24 NOTE — ED Triage Notes (Signed)
Here for cold/sinus sx onset yest associated w/prod cough, facial pressure, hoarseness, heart burns   Also reports persistent abscess on left breast... Seen at [facility] ED on 3/21 for similar sx  Denies fevers  A&O x4... NAD

## 2016-06-24 NOTE — ED Provider Notes (Signed)
CSN: [id number]     Arrival date & time 06/24/16  1743 History   First MD Initiated Contact with Patient 06/24/16 1902     Chief Complaint  Patient presents with  . URI  . Abscess   (Consider location/radiation/quality/duration/timing/severity/associated sxs/prior Treatment) 36 year old female complaining of PND, stuffy nose, runny nose, sinus congestion for 2 days. Denies fever.      Past Medical History:  Diagnosis Date  . Gestational diabetes    diet controlled  . IBS (irritable bowel syndrome)   . Polycystic ovarian syndrome   . Pregnancy induced hypertension    with G1  . Sleep apnea    h/o sleep apnea when overweight- no CPAP   Past Surgical History:  Procedure Laterality Date  . BILATERAL SALPINGECTOMY Bilateral 11/19/2014   Procedure: BILATERAL SALPINGECTOMY;  Surgeon: Osborn Coho, MD;  Location: [facility];  Service: Obstetrics;  Laterality: Bilateral;  . CESAREAN SECTION     x 3  . CESAREAN SECTION  08/13/2011   Procedure: CESAREAN SECTION;  Surgeon: Purcell Nails, MD;  Location: [facility];  Service: Gynecology;  Laterality: N/A;  Repeat cesarean section with delivery of baby girl at 29.  Apgars 8/8/9.  Marland Kitchen CESAREAN SECTION MULTI-GESTATIONAL N/A 11/19/2014   Procedure: CESAREAN SECTION MULTI-GESTATIONAL;  Surgeon: Osborn Coho, MD;  Location: [facility];  Service: Obstetrics;  Laterality: N/A;  . DILATION AND CURETTAGE OF UTERUS     Family History  Problem Relation Age of Onset  . Diabetes Mother   . Diabetes Maternal Grandmother   . Cancer Maternal Grandmother   . Diabetes Maternal Grandfather   . Diabetes Paternal Grandmother   . Diabetes Paternal Grandfather   . Anesthesia problems Neg Hx   . Hypotension Neg Hx   . Malignant hyperthermia Neg Hx   . Pseudochol deficiency Neg Hx    Social History  Substance Use Topics  . Smoking status: Never Smoker  . Smokeless tobacco: Never Used  . Alcohol use No   OB History    Gravida Para Term Preterm AB Living   0 2 3   SAB TAB Ectopic Multiple Live Births   1 1 0 1 3     Review of Systems  Constitutional: Negative for activity change, appetite change, chills, fatigue and fever.  HENT: Positive for congestion, postnasal drip, rhinorrhea and sinus pressure.   Eyes: Negative.   Respiratory: Negative.  Negative for shortness of breath and wheezing.   Cardiovascular: Negative.   Gastrointestinal: Negative.   Musculoskeletal: Negative for neck pain and neck stiffness.  Skin: Negative for pallor and rash.  Neurological: Negative.     Allergies  Bactrim [sulfamethoxazole-trimethoprim] and Sulfa antibiotics  Home Medications   Prior to Admission medications   Medication Sig Start Date End Date Taking? Authorizing Provider  ibuprofen (ADVIL,MOTRIN) 200 MG tablet Take 200 mg by mouth every 6 (six) hours as needed for fever or mild pain.    Historical Provider, MD  naproxen (NAPROSYN) 500 MG tablet Take 1 tablet (500 mg total) by mouth 2 (two) times daily. 06/08/16   Maia Plan, MD  oseltamivir (TAMIFLU) 75 MG capsule Take 1 capsule (75 mg total) by mouth every 12 (twelve) hours. 06/08/16   Maia Plan, MD   Meds Ordered and Administered this Visit  Medications - No data to display  BP (!) 143/84 (BP Location: Left Arm)   Pulse 88   Temp 99.1 F (37.3 C) (Oral)   Resp 16  LMP 05/25/2016   SpO2 97%   Breastfeeding? No  No data found.   Physical Exam  Constitutional: She appears well-developed and well-nourished. No distress.  HENT:  Head: Normocephalic and atraumatic.  Right Ear: External ear normal.  Left Ear: External ear normal.  Mouth/Throat: No oropharyngeal exudate.  Oropharynx with minimal erythema and clear PND. No exudates or swelling. Bilateral TMs are normal.  Eyes: EOM are normal.  Neck: Normal range of motion. Neck supple.  Cardiovascular: Normal rate, regular rhythm and normal heart sounds.   Pulmonary/Chest: Effort normal and breath sounds normal. No  respiratory distress. She exhibits no tenderness.  Musculoskeletal: She exhibits no edema.  Lymphadenopathy:    She has no cervical adenopathy.  Neurological: She is alert.  Skin: Skin is warm.  Psychiatric: Her affect is blunt and inappropriate.  Nursing note and vitals reviewed.   Urgent Care Course     Procedures (including critical care time)  Labs Review Labs Reviewed - No data to display  Imaging Review No results found.   Visual Acuity Review  Right Eye Distance:   Left Eye Distance:   Bilateral Distance:    Right Eye Near:   Left Eye Near:    Bilateral Near:         MDM   1. Acute seasonal allergic rhinitis due to pollen   2. Allergic sinusitis    Patient is argumentative and demanding. It was explained that her symptoms are characteristic and typical of her usual seasonal allergies. There are no signs or symptoms of a bacterial sinus infection. Even at some doctors in the past and given her amoxicillin she is expecting to get a prescription for that. She continued to argue the point to a point where further discussion was useless. She wanted to see a doctor or a PA to get a prescription for amoxicillin. Neither are here. She wants to speak to the supervisor and Efraim Kaufmann was asked to go to her room. Drink plenty of fluids and stay well-hydrated. Use copious amounts of saline nasal spray. May use steam to help with opening your sinuses or use a Netty pot. There is no evidence of sinus infection. This was discussed in detail during the visit. Sudafed PE 10 mg every 4 to 6 hours as needed for congestion Allegra or Zyrtec daily as needed for drainage and runny nose. For stronger antihistamine may take Chlor-Trimeton 2 to 4 mg every 4 to 6 hours, may cause drowsiness. Saline nasal spray used frequently. Ibuprofen 600 mg every 6 hours as needed for pain, discomfort or fever. Drink plenty of fluids and stay well-hydrated. Flonase or Rhinocort nasal spray daily     Hayden Rasmussen, NP 06/24/16 1935

## 2016-10-18 ENCOUNTER — Other Ambulatory Visit: Payer: Self-pay | Admitting: General Surgery

## 2016-10-18 DIAGNOSIS — K429 Umbilical hernia without obstruction or gangrene: Secondary | ICD-10-CM

## 2016-10-25 ENCOUNTER — Other Ambulatory Visit: Payer: Medicaid Other

## 2016-10-27 ENCOUNTER — Other Ambulatory Visit: Payer: Medicaid Other

## 2016-10-28 ENCOUNTER — Ambulatory Visit
Admission: RE | Admit: 2016-10-28 | Discharge: 2016-10-28 | Disposition: A | Discharge status: Home / Self Care | Payer: Medicaid Other | Source: Ambulatory Visit | Attending: General Surgery | Admitting: General Surgery

## 2016-10-28 DIAGNOSIS — K429 Umbilical hernia without obstruction or gangrene: Secondary | ICD-10-CM

## 2016-10-28 MED ORDER — IOPAMIDOL (ISOVUE-300) INJECTION 61%
125.0000 mL | Freq: Once | INTRAVENOUS | Status: AC | PRN
  Administered 2016-10-28: 125 mL via INTRAVENOUS

## 2016-12-21 ENCOUNTER — Other Ambulatory Visit: Payer: Self-pay | Admitting: Obstetrics and Gynecology

## 2019-02-18 IMAGING — CT CT ABD-PELV W/ CM
3 of 4 series · 12 of 36 positions shown, 18 images · IV contrast (READICAT/WATER & [ID] ISOVUE 300)
Comparison: CT the abdomen and pelvis 03/01/2007.

CLINICAL DATA: 35-year-old female with history of umbilical hernia.
Abdominal pain.

EXAM:
CT ABDOMEN AND PELVIS WITH CONTRAST
TECHNIQUE: Multidetector CT imaging of the abdomen and pelvis was performed
using the standard protocol following bolus administration of
intravenous contrast.
CONTRAST:  125mL A4DLGL-BOO IOPAMIDOL (A4DLGL-BOO) INJECTION 61%

[Series 3: abd/pelvis with · axial · 0.78mm/px · z∈[-308,+22]mm · 8 of 86 slices shown, 13 images]
[im 10/86  soft-tissue]
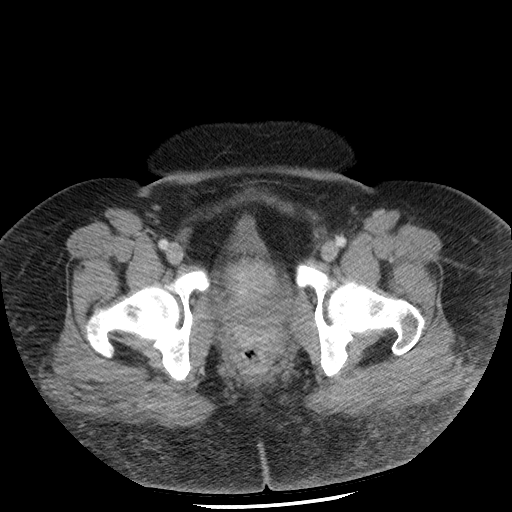
[im 10/86  bone]
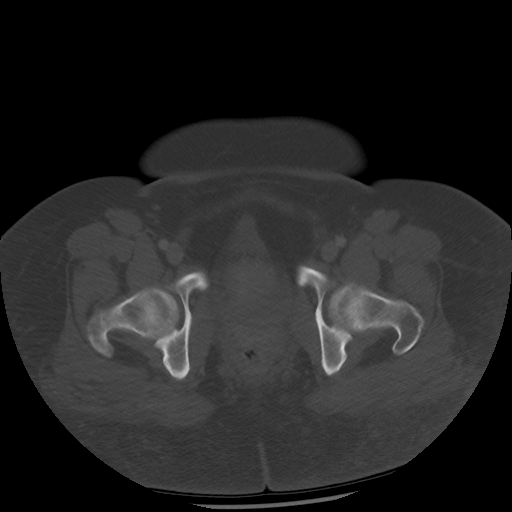
[im 19/86  soft-tissue]
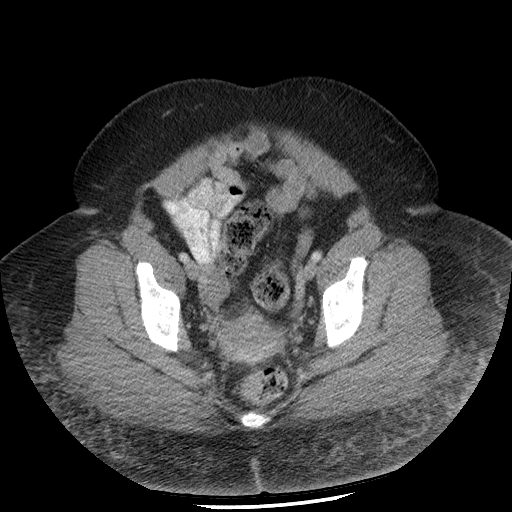
[im 29/86  soft-tissue]
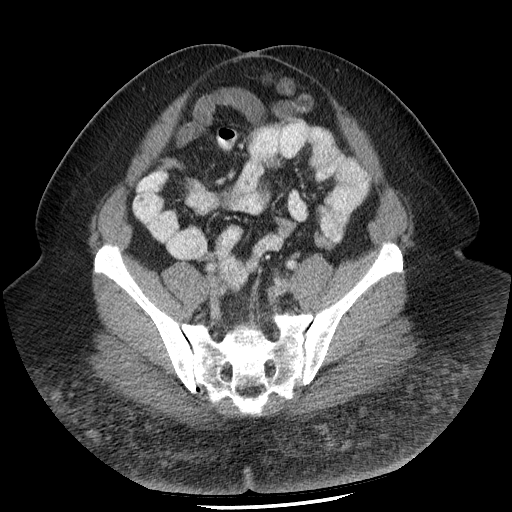
[im 38/86  soft-tissue]
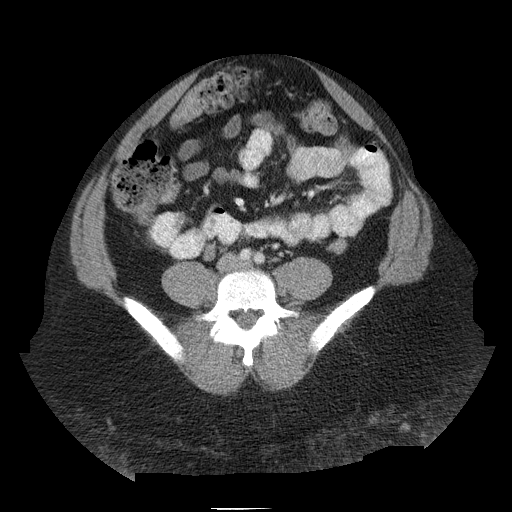
[im 48/86  soft-tissue]
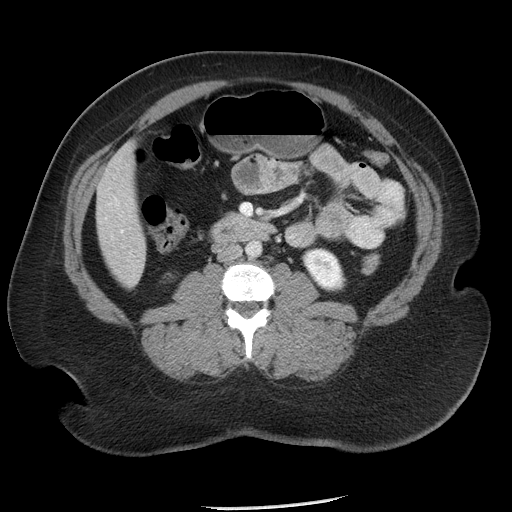
[im 48/86  lung]
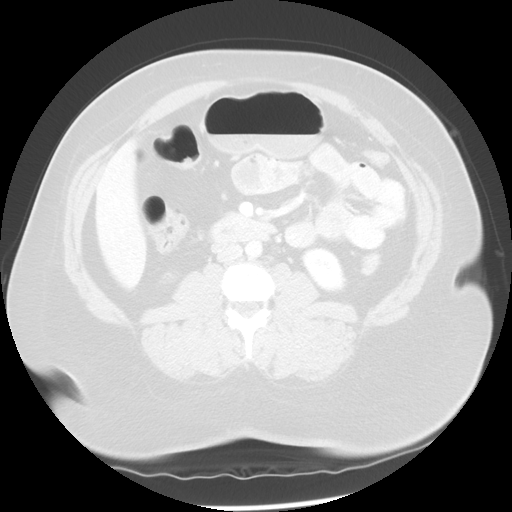
[im 57/86  soft-tissue]
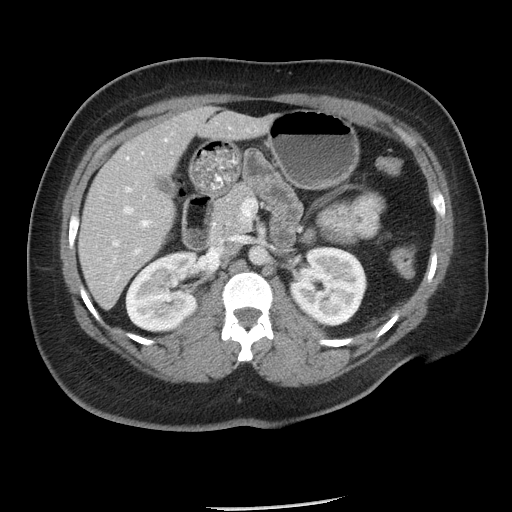
[im 57/86  lung]
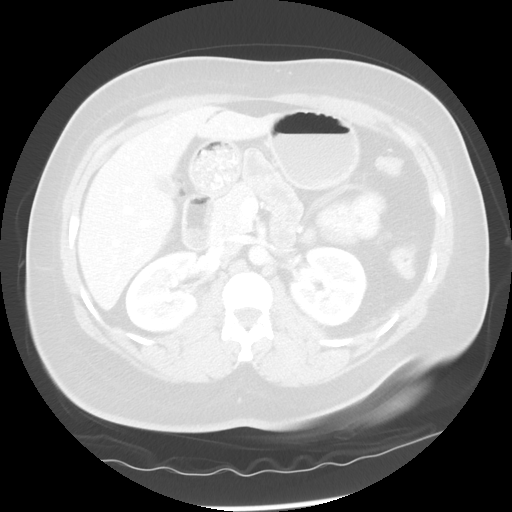
[im 67/86  soft-tissue]
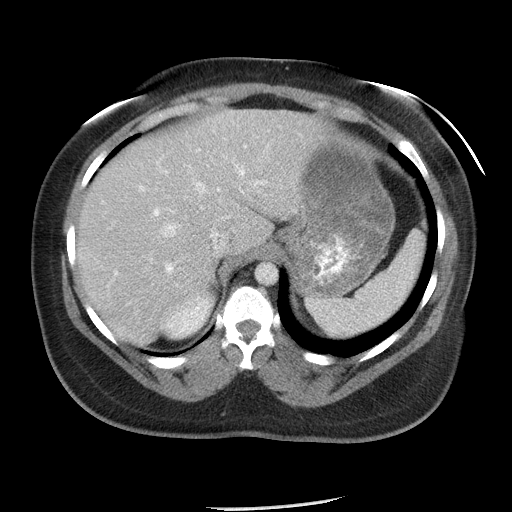
[im 67/86  lung]
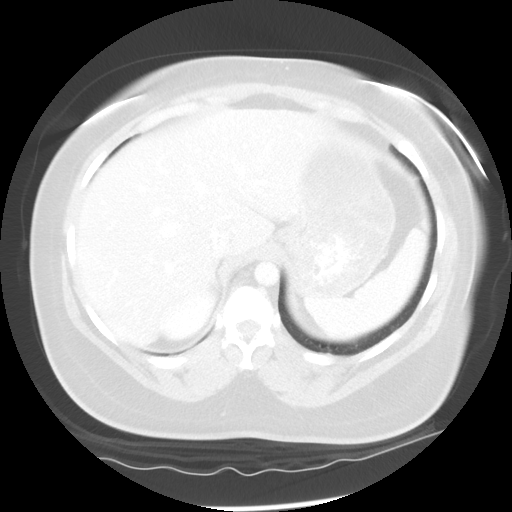
[im 76/86  soft-tissue]
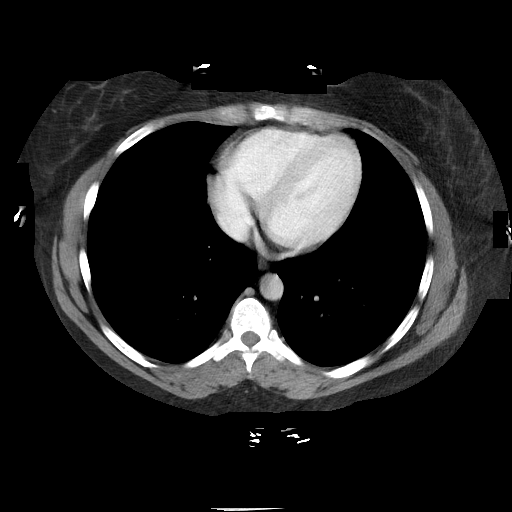
[im 76/86  lung]
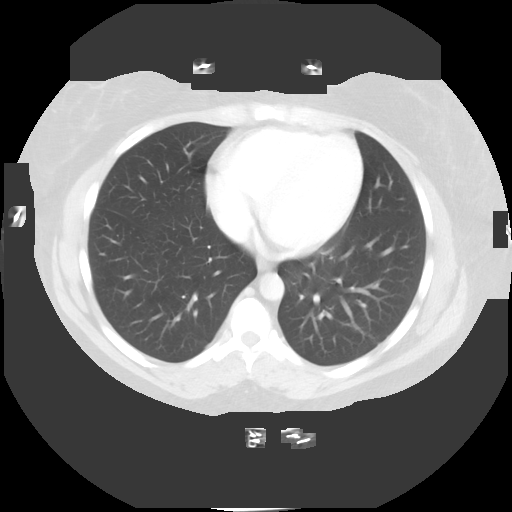

[Series 601: coronal body · coronal · 0.89mm/px · 1 of 146 slices shown, 2 images]
[im 49/146  soft-tissue]
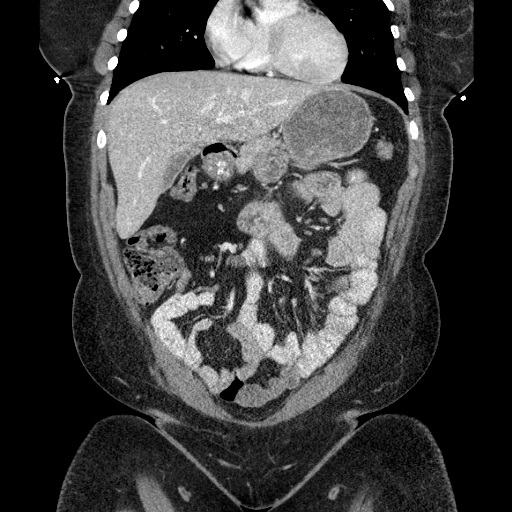
[im 49/146  bone]
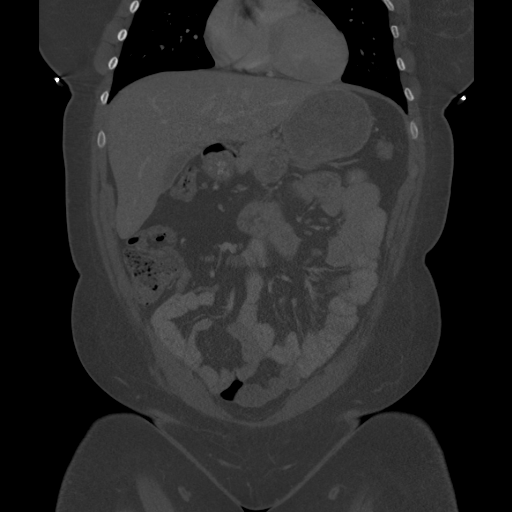

[Series 602: sagittal body · sagittal · 0.89mm/px · 3 of 161 slices shown]
[im 18/161  soft-tissue]
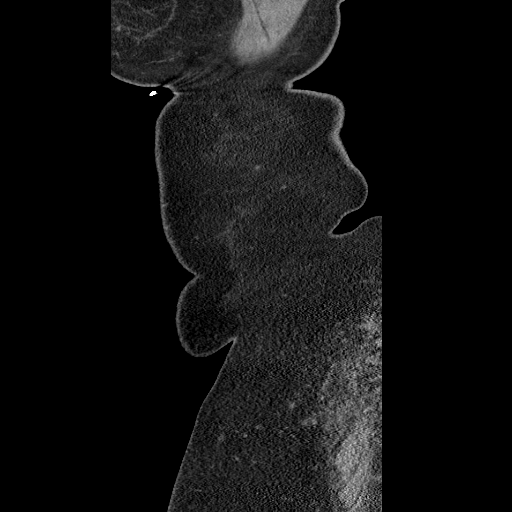
[im 36/161  soft-tissue]
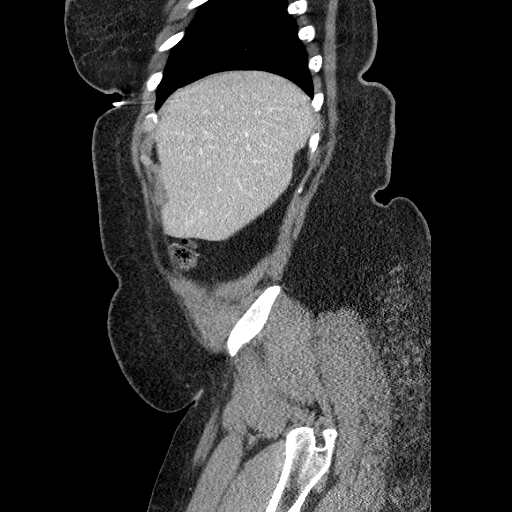
[im 54/161  soft-tissue]
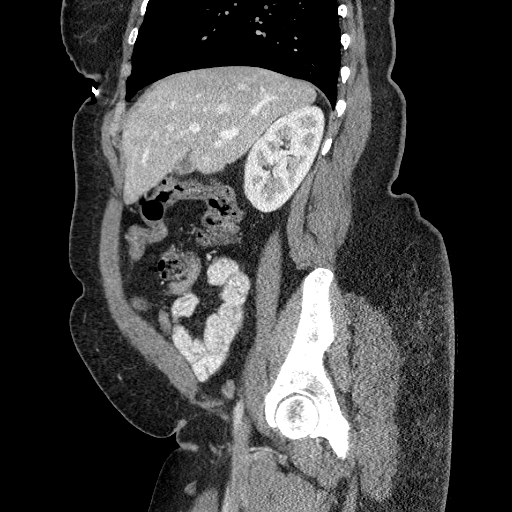

[12 of 36 positions shown; findings below may reference images not displayed]

FINDINGS: Lower chest: Unremarkable.

Hepatobiliary: No suspicious cystic or solid hepatic lesions. No
intra or extrahepatic biliary ductal dilatation. Gallbladder is
normal in appearance.

Pancreas: No pancreatic mass. No pancreatic ductal dilatation. No
pancreatic or peripancreatic fluid or inflammatory changes.

Spleen: Unremarkable.

Adrenals/Urinary Tract: 2 mm nonobstructive calculus in the lower
pole collecting system of the right kidney. Bilateral kidneys and
bilateral adrenal glands are otherwise normal in appearance. No
hydroureteronephrosis. Urinary bladder is normal in appearance.
Bilateral adrenal glands are normal in appearance.

Stomach/Bowel: Normal appearance of the stomach. No pathologic
dilatation of small bowel or colon. The appendix is not confidently
identified and may be surgically absent. Regardless, there are no
inflammatory changes noted adjacent to the cecum to suggest the
presence of an acute appendicitis at this time.

Vascular/Lymphatic: No significant atherosclerotic disease, aneurysm
or dissection noted in the abdominal or pelvic vasculature. No
lymphadenopathy noted in the abdomen or pelvis.

Reproductive: Uterus and ovaries are unremarkable in appearance.

Other: No significant volume of ascites.  No pneumoperitoneum.

Musculoskeletal: Severe diastases recti. Tiny umbilical hernia.
There are no aggressive appearing lytic or blastic lesions noted in
the visualized portions of the skeleton.
IMPRESSION: 1. Tiny umbilical hernia containing only omental fat. Severe
diastases recti.
2. No acute findings noted in the abdomen or pelvis.
3. 2 mm nonobstructive calculus in the lower pole collecting system
of the right kidney.

## 2019-04-22 ENCOUNTER — Ambulatory Visit: Payer: Medicaid Other | Attending: Internal Medicine

## 2019-04-22 DIAGNOSIS — Z20822 Contact with and (suspected) exposure to covid-19: Secondary | ICD-10-CM

## 2019-04-23 LAB — NOVEL CORONAVIRUS, NAA: SARS-CoV-2, NAA: NOT DETECTED

## 2019-12-27 ENCOUNTER — Encounter (HOSPITAL_COMMUNITY): Payer: Self-pay

## 2019-12-27 ENCOUNTER — Emergency Department (HOSPITAL_COMMUNITY)
Admission: EM | Admit: 2019-12-27 | Discharge: 2019-12-27 | Disposition: A | Discharge status: Home / Self Care | Payer: Medicaid Other | Attending: Emergency Medicine | Admitting: Emergency Medicine

## 2019-12-27 ENCOUNTER — Other Ambulatory Visit: Payer: Self-pay

## 2019-12-27 DIAGNOSIS — Z4803 Encounter for change or removal of drains: Secondary | ICD-10-CM | POA: Insufficient documentation

## 2019-12-27 DIAGNOSIS — Z5189 Encounter for other specified aftercare: Secondary | ICD-10-CM

## 2019-12-27 NOTE — ED Triage Notes (Signed)
Pt here from [place] after having hernia repair and tummy tuck. Pt states she was here to have the 2 drains removed.
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
## 2019-12-27 NOTE — Discharge Instructions (Addendum)
Surgical drains are not removed in the emergency department.  Please contact our outpatient surgical center for an appointment.  In the meantime, keep the surgical sites clean and dry.

## 2019-12-27 NOTE — ED Notes (Signed)
Patient complaining of nobody will take her drains out. Patient went to [facility] and got a bbl. Patient states that her drains need to taken out. PA states that he will give her referral to the surgery clinic. Patient wants them out tonight.

## 2020-07-08 DIAGNOSIS — Z6841 Body Mass Index (BMI) 40.0 and over, adult: Secondary | ICD-10-CM | POA: Diagnosis not present

## 2020-07-08 DIAGNOSIS — D649 Anemia, unspecified: Secondary | ICD-10-CM | POA: Diagnosis not present

## 2020-07-08 DIAGNOSIS — E559 Vitamin D deficiency, unspecified: Secondary | ICD-10-CM | POA: Diagnosis not present

## 2020-07-08 DIAGNOSIS — Z01419 Encounter for gynecological examination (general) (routine) without abnormal findings: Secondary | ICD-10-CM | POA: Diagnosis not present

## 2020-07-08 DIAGNOSIS — Z0000 Encounter for general adult medical examination without abnormal findings: Secondary | ICD-10-CM | POA: Diagnosis not present

## 2020-07-08 DIAGNOSIS — Z113 Encounter for screening for infections with a predominantly sexual mode of transmission: Secondary | ICD-10-CM | POA: Diagnosis not present

## 2020-07-08 DIAGNOSIS — Z319 Encounter for procreative management, unspecified: Secondary | ICD-10-CM | POA: Diagnosis not present

## 2020-07-08 DIAGNOSIS — Z124 Encounter for screening for malignant neoplasm of cervix: Secondary | ICD-10-CM | POA: Diagnosis not present

## 2020-09-28 ENCOUNTER — Other Ambulatory Visit: Payer: Self-pay | Admitting: Obstetrics and Gynecology

## 2020-09-28 DIAGNOSIS — R87610 Atypical squamous cells of undetermined significance on cytologic smear of cervix (ASC-US): Secondary | ICD-10-CM | POA: Diagnosis not present

## 2020-09-28 DIAGNOSIS — E559 Vitamin D deficiency, unspecified: Secondary | ICD-10-CM | POA: Diagnosis not present

## 2020-11-10 DIAGNOSIS — J029 Acute pharyngitis, unspecified: Secondary | ICD-10-CM | POA: Diagnosis not present

## 2020-11-25 DIAGNOSIS — R070 Pain in throat: Secondary | ICD-10-CM | POA: Diagnosis not present

## 2020-11-25 DIAGNOSIS — D509 Iron deficiency anemia, unspecified: Secondary | ICD-10-CM | POA: Diagnosis not present

## 2020-11-25 DIAGNOSIS — Z9109 Other allergy status, other than to drugs and biological substances: Secondary | ICD-10-CM | POA: Diagnosis not present

## 2020-11-25 DIAGNOSIS — E559 Vitamin D deficiency, unspecified: Secondary | ICD-10-CM | POA: Diagnosis not present

## 2021-05-24 DIAGNOSIS — S39012A Strain of muscle, fascia and tendon of lower back, initial encounter: Secondary | ICD-10-CM | POA: Diagnosis not present

## 2021-06-07 ENCOUNTER — Ambulatory Visit: Payer: Medicaid Other | Attending: Family Medicine | Admitting: Physical Therapy

## 2021-06-07 ENCOUNTER — Encounter: Payer: Self-pay | Admitting: Physical Therapy

## 2021-06-07 ENCOUNTER — Other Ambulatory Visit: Payer: Self-pay

## 2021-06-07 DIAGNOSIS — M545 Low back pain, unspecified: Secondary | ICD-10-CM | POA: Diagnosis not present

## 2021-06-07 DIAGNOSIS — M62830 Muscle spasm of back: Secondary | ICD-10-CM | POA: Insufficient documentation

## 2021-06-07 DIAGNOSIS — M6281 Muscle weakness (generalized): Secondary | ICD-10-CM | POA: Diagnosis not present
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
## 2021-06-21 ENCOUNTER — Ambulatory Visit: Payer: Medicaid Other | Attending: Family Medicine | Admitting: Physical Therapy

## 2021-06-21 ENCOUNTER — Encounter: Payer: Self-pay | Admitting: Physical Therapy

## 2021-06-21 DIAGNOSIS — M545 Low back pain, unspecified: Secondary | ICD-10-CM | POA: Insufficient documentation

## 2021-06-21 DIAGNOSIS — M6281 Muscle weakness (generalized): Secondary | ICD-10-CM | POA: Diagnosis not present

## 2021-06-21 DIAGNOSIS — M62830 Muscle spasm of back: Secondary | ICD-10-CM | POA: Diagnosis not present
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
## 2021-06-24 ENCOUNTER — Ambulatory Visit: Payer: Medicaid Other | Admitting: Physical Therapy

## 2021-06-28 ENCOUNTER — Ambulatory Visit: Payer: Medicaid Other | Admitting: Physical Therapy

## 2021-07-01 ENCOUNTER — Ambulatory Visit: Payer: Medicaid Other | Admitting: Physical Therapy

## 2021-07-02 ENCOUNTER — Encounter: Payer: Self-pay | Admitting: Physical Therapy

## 2021-07-02 ENCOUNTER — Ambulatory Visit: Payer: Medicaid Other | Admitting: Physical Therapy

## 2021-07-02 DIAGNOSIS — M545 Low back pain, unspecified: Secondary | ICD-10-CM | POA: Diagnosis not present

## 2021-07-02 DIAGNOSIS — M62830 Muscle spasm of back: Secondary | ICD-10-CM | POA: Diagnosis not present

## 2021-07-02 DIAGNOSIS — M6281 Muscle weakness (generalized): Secondary | ICD-10-CM | POA: Diagnosis not present

## 2021-07-02 NOTE — Patient Instructions (Signed)
Access Code: [email or web address] ?URL: [email or web address] ?Date: 07/02/2021 ?Prepared by: Oley Balm ? ?Exercises ?- Supine Hamstring Stretch with Strap  - 1 x daily - 7 x weekly - 3 sets - 10 reps ?- Supine ITB Stretch with Strap  - 1 x daily - 7 x weekly - 3 sets - 10 reps ?- Hip Adductors and Hamstring Stretch with Strap  - 1 x daily - 7 x weekly - 3 sets - 10 reps ?- Supine ITB Stretch  - 1 x daily - 7 x weekly - 3 sets - 20 hold ?- Supine Figure 4 Piriformis Stretch  - 1 x daily - 7 x weekly - 3 sets - 20 hold ?- Single Knee to Chest Stretch  - 1 x daily - 7 x weekly - 3 sets - 20 hold ?- Supine Double Knee to Chest  - 1 x daily - 7 x weekly - 3 sets - 20 hold ?- Supine Bridge  - 1 x daily - 7 x weekly - 2 sets - 10 reps ?- Hooklying Clamshell with Resistance  - 1 x daily - 7 x weekly - 2 sets - 10 reps ?- Supine Bridge with Resistance Band  - 1 x daily - 7 x weekly - 2 sets - 10 reps ?
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
## 2021-07-05 ENCOUNTER — Ambulatory Visit: Payer: Medicaid Other | Admitting: Physical Therapy

## 2021-07-08 ENCOUNTER — Ambulatory Visit: Payer: Medicaid Other | Admitting: Physical Therapy

## 2021-07-08 ENCOUNTER — Encounter: Payer: Self-pay | Admitting: Physical Therapy

## 2021-07-08 DIAGNOSIS — M545 Low back pain, unspecified: Secondary | ICD-10-CM

## 2021-07-08 DIAGNOSIS — M62830 Muscle spasm of back: Secondary | ICD-10-CM

## 2021-07-08 DIAGNOSIS — M6281 Muscle weakness (generalized): Secondary | ICD-10-CM
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
## 2021-07-15 ENCOUNTER — Encounter: Payer: Self-pay | Admitting: Physical Therapy

## 2021-07-15 ENCOUNTER — Ambulatory Visit: Payer: Medicaid Other | Admitting: Physical Therapy

## 2021-07-15 DIAGNOSIS — M545 Low back pain, unspecified: Secondary | ICD-10-CM | POA: Diagnosis not present

## 2021-07-15 DIAGNOSIS — M62830 Muscle spasm of back: Secondary | ICD-10-CM

## 2021-07-15 DIAGNOSIS — M6281 Muscle weakness (generalized): Secondary | ICD-10-CM | POA: Diagnosis not present
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
## 2021-07-22 ENCOUNTER — Encounter: Payer: Self-pay | Admitting: Physical Therapy

## 2021-07-22 ENCOUNTER — Ambulatory Visit: Payer: Medicaid Other | Attending: Family Medicine | Admitting: Physical Therapy

## 2021-07-22 DIAGNOSIS — M6281 Muscle weakness (generalized): Secondary | ICD-10-CM | POA: Diagnosis not present

## 2021-07-22 DIAGNOSIS — M62830 Muscle spasm of back: Secondary | ICD-10-CM | POA: Diagnosis not present

## 2021-07-22 DIAGNOSIS — M545 Low back pain, unspecified: Secondary | ICD-10-CM | POA: Diagnosis not present
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
## 2021-07-26 DIAGNOSIS — Z9109 Other allergy status, other than to drugs and biological substances: Secondary | ICD-10-CM | POA: Diagnosis not present

## 2021-07-26 DIAGNOSIS — K5904 Chronic idiopathic constipation: Secondary | ICD-10-CM | POA: Diagnosis not present

## 2021-07-26 DIAGNOSIS — J302 Other seasonal allergic rhinitis: Secondary | ICD-10-CM | POA: Diagnosis not present

## 2021-07-26 DIAGNOSIS — F988 Other specified behavioral and emotional disorders with onset usually occurring in childhood and adolescence: Secondary | ICD-10-CM | POA: Diagnosis not present

## 2021-07-26 DIAGNOSIS — E669 Obesity, unspecified: Secondary | ICD-10-CM | POA: Diagnosis not present

## 2021-07-29 ENCOUNTER — Encounter: Payer: Self-pay | Admitting: Physical Therapy

## 2021-07-29 ENCOUNTER — Ambulatory Visit: Payer: Medicaid Other | Admitting: Physical Therapy

## 2021-07-29 DIAGNOSIS — M62830 Muscle spasm of back: Secondary | ICD-10-CM

## 2021-07-29 DIAGNOSIS — M6281 Muscle weakness (generalized): Secondary | ICD-10-CM | POA: Diagnosis not present

## 2021-07-29 DIAGNOSIS — M545 Low back pain, unspecified: Secondary | ICD-10-CM | POA: Diagnosis not present
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
## 2021-08-04 ENCOUNTER — Ambulatory Visit: Payer: Medicaid Other | Admitting: Physical Therapy

## 2021-08-09 ENCOUNTER — Ambulatory Visit: Payer: Medicaid Other | Admitting: Physical Therapy

## 2021-08-09 ENCOUNTER — Encounter: Payer: Self-pay | Admitting: Physical Therapy

## 2021-08-09 DIAGNOSIS — M62830 Muscle spasm of back: Secondary | ICD-10-CM | POA: Diagnosis not present

## 2021-08-09 DIAGNOSIS — M6281 Muscle weakness (generalized): Secondary | ICD-10-CM

## 2021-08-09 DIAGNOSIS — M545 Low back pain, unspecified: Secondary | ICD-10-CM
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
## 2021-08-24 ENCOUNTER — Ambulatory Visit: Payer: Medicaid Other | Admitting: Physical Therapy

## 2021-08-31 ENCOUNTER — Ambulatory Visit: Payer: Medicaid Other | Admitting: Physical Therapy

## 2021-09-07 ENCOUNTER — Ambulatory Visit: Payer: Medicaid Other | Admitting: Physical Therapy

## 2021-09-14 ENCOUNTER — Ambulatory Visit: Payer: Medicaid Other | Admitting: Physical Therapy

## 2021-12-10 DIAGNOSIS — K5904 Chronic idiopathic constipation: Secondary | ICD-10-CM | POA: Diagnosis not present

## 2021-12-10 DIAGNOSIS — J3089 Other allergic rhinitis: Secondary | ICD-10-CM | POA: Diagnosis not present

## 2021-12-10 DIAGNOSIS — F988 Other specified behavioral and emotional disorders with onset usually occurring in childhood and adolescence: Secondary | ICD-10-CM | POA: Diagnosis not present

## 2021-12-10 DIAGNOSIS — F322 Major depressive disorder, single episode, severe without psychotic features: Secondary | ICD-10-CM | POA: Diagnosis not present

## 2021-12-10 DIAGNOSIS — F419 Anxiety disorder, unspecified: Secondary | ICD-10-CM | POA: Diagnosis not present

## 2022-01-02 DIAGNOSIS — J328 Other chronic sinusitis: Secondary | ICD-10-CM | POA: Diagnosis not present

## 2022-01-02 DIAGNOSIS — R0981 Nasal congestion: Secondary | ICD-10-CM | POA: Diagnosis not present

## 2022-01-02 DIAGNOSIS — Z20818 Contact with and (suspected) exposure to other bacterial communicable diseases: Secondary | ICD-10-CM | POA: Diagnosis not present

## 2022-01-02 DIAGNOSIS — J3489 Other specified disorders of nose and nasal sinuses: Secondary | ICD-10-CM | POA: Diagnosis not present

## 2022-01-26 DIAGNOSIS — F419 Anxiety disorder, unspecified: Secondary | ICD-10-CM | POA: Diagnosis not present

## 2022-01-26 DIAGNOSIS — Z0000 Encounter for general adult medical examination without abnormal findings: Secondary | ICD-10-CM | POA: Diagnosis not present

## 2022-01-26 DIAGNOSIS — F322 Major depressive disorder, single episode, severe without psychotic features: Secondary | ICD-10-CM | POA: Diagnosis not present

## 2022-02-01 DIAGNOSIS — F331 Major depressive disorder, recurrent, moderate: Secondary | ICD-10-CM | POA: Diagnosis not present

## 2022-02-01 DIAGNOSIS — F411 Generalized anxiety disorder: Secondary | ICD-10-CM | POA: Diagnosis not present

## 2022-02-01 DIAGNOSIS — F902 Attention-deficit hyperactivity disorder, combined type: Secondary | ICD-10-CM | POA: Diagnosis not present

## 2022-02-15 DIAGNOSIS — F331 Major depressive disorder, recurrent, moderate: Secondary | ICD-10-CM | POA: Diagnosis not present

## 2022-02-15 DIAGNOSIS — F411 Generalized anxiety disorder: Secondary | ICD-10-CM | POA: Diagnosis not present

## 2022-02-15 DIAGNOSIS — F902 Attention-deficit hyperactivity disorder, combined type: Secondary | ICD-10-CM | POA: Diagnosis not present

## 2022-02-16 DIAGNOSIS — F419 Anxiety disorder, unspecified: Secondary | ICD-10-CM | POA: Diagnosis not present

## 2022-02-16 DIAGNOSIS — F322 Major depressive disorder, single episode, severe without psychotic features: Secondary | ICD-10-CM | POA: Diagnosis not present

## 2022-02-16 DIAGNOSIS — Z111 Encounter for screening for respiratory tuberculosis: Secondary | ICD-10-CM | POA: Diagnosis not present

## 2022-05-20 DIAGNOSIS — F418 Other specified anxiety disorders: Secondary | ICD-10-CM | POA: Diagnosis not present

## 2022-05-20 DIAGNOSIS — B9689 Other specified bacterial agents as the cause of diseases classified elsewhere: Secondary | ICD-10-CM | POA: Diagnosis not present

## 2022-05-20 DIAGNOSIS — J329 Chronic sinusitis, unspecified: Secondary | ICD-10-CM | POA: Diagnosis not present

## 2022-05-20 DIAGNOSIS — Z9109 Other allergy status, other than to drugs and biological substances: Secondary | ICD-10-CM | POA: Diagnosis not present

## 2022-05-20 DIAGNOSIS — F5105 Insomnia due to other mental disorder: Secondary | ICD-10-CM | POA: Diagnosis not present

## 2022-05-20 DIAGNOSIS — E559 Vitamin D deficiency, unspecified: Secondary | ICD-10-CM | POA: Diagnosis not present

## 2022-05-20 DIAGNOSIS — J302 Other seasonal allergic rhinitis: Secondary | ICD-10-CM | POA: Diagnosis not present

## 2022-05-20 DIAGNOSIS — F322 Major depressive disorder, single episode, severe without psychotic features: Secondary | ICD-10-CM | POA: Diagnosis not present

## 2022-05-20 DIAGNOSIS — Z20822 Contact with and (suspected) exposure to covid-19: Secondary | ICD-10-CM | POA: Diagnosis not present

## 2022-05-20 DIAGNOSIS — R059 Cough, unspecified: Secondary | ICD-10-CM | POA: Diagnosis not present

## 2022-08-22 DIAGNOSIS — Z131 Encounter for screening for diabetes mellitus: Secondary | ICD-10-CM | POA: Diagnosis not present

## 2022-08-22 DIAGNOSIS — R030 Elevated blood-pressure reading, without diagnosis of hypertension: Secondary | ICD-10-CM | POA: Diagnosis not present

## 2022-08-22 DIAGNOSIS — G479 Sleep disorder, unspecified: Secondary | ICD-10-CM | POA: Diagnosis not present

## 2022-10-24 DIAGNOSIS — F988 Other specified behavioral and emotional disorders with onset usually occurring in childhood and adolescence: Secondary | ICD-10-CM | POA: Diagnosis not present

## 2022-10-24 DIAGNOSIS — F419 Anxiety disorder, unspecified: Secondary | ICD-10-CM | POA: Diagnosis not present

## 2022-12-27 DIAGNOSIS — Z1231 Encounter for screening mammogram for malignant neoplasm of breast: Secondary | ICD-10-CM | POA: Diagnosis not present

## 2022-12-27 DIAGNOSIS — F419 Anxiety disorder, unspecified: Secondary | ICD-10-CM | POA: Diagnosis not present

## 2022-12-27 DIAGNOSIS — F322 Major depressive disorder, single episode, severe without psychotic features: Secondary | ICD-10-CM | POA: Diagnosis not present

## 2023-02-07 DIAGNOSIS — F902 Attention-deficit hyperactivity disorder, combined type: Secondary | ICD-10-CM | POA: Diagnosis not present

## 2023-02-07 DIAGNOSIS — F411 Generalized anxiety disorder: Secondary | ICD-10-CM | POA: Diagnosis not present

## 2023-02-07 DIAGNOSIS — F331 Major depressive disorder, recurrent, moderate: Secondary | ICD-10-CM | POA: Diagnosis not present

## 2023-02-14 DIAGNOSIS — M7912 Myalgia of auxiliary muscles, head and neck: Secondary | ICD-10-CM | POA: Diagnosis not present

## 2023-02-14 DIAGNOSIS — J029 Acute pharyngitis, unspecified: Secondary | ICD-10-CM | POA: Diagnosis not present

## 2023-02-23 DIAGNOSIS — R590 Localized enlarged lymph nodes: Secondary | ICD-10-CM | POA: Diagnosis not present

## 2023-03-06 DIAGNOSIS — F411 Generalized anxiety disorder: Secondary | ICD-10-CM | POA: Diagnosis not present

## 2023-03-06 DIAGNOSIS — F902 Attention-deficit hyperactivity disorder, combined type: Secondary | ICD-10-CM | POA: Diagnosis not present

## 2023-03-06 DIAGNOSIS — F331 Major depressive disorder, recurrent, moderate: Secondary | ICD-10-CM | POA: Diagnosis not present

## 2023-03-07 DIAGNOSIS — L040 Acute lymphadenitis of face, head and neck: Secondary | ICD-10-CM | POA: Diagnosis not present

## 2023-03-30 DIAGNOSIS — E039 Hypothyroidism, unspecified: Secondary | ICD-10-CM | POA: Diagnosis not present

## 2023-03-30 DIAGNOSIS — R7303 Prediabetes: Secondary | ICD-10-CM | POA: Diagnosis not present

## 2023-03-30 DIAGNOSIS — K5900 Constipation, unspecified: Secondary | ICD-10-CM | POA: Diagnosis not present

## 2023-03-30 DIAGNOSIS — E785 Hyperlipidemia, unspecified: Secondary | ICD-10-CM | POA: Diagnosis not present

## 2023-03-30 DIAGNOSIS — N951 Menopausal and female climacteric states: Secondary | ICD-10-CM | POA: Diagnosis not present

## 2023-03-30 DIAGNOSIS — R0683 Snoring: Secondary | ICD-10-CM | POA: Diagnosis not present

## 2023-03-30 DIAGNOSIS — F3341 Major depressive disorder, recurrent, in partial remission: Secondary | ICD-10-CM | POA: Diagnosis not present

## 2023-03-30 DIAGNOSIS — E611 Iron deficiency: Secondary | ICD-10-CM | POA: Diagnosis not present

## 2023-03-30 DIAGNOSIS — E559 Vitamin D deficiency, unspecified: Secondary | ICD-10-CM | POA: Diagnosis not present

## 2023-03-30 DIAGNOSIS — Z6839 Body mass index (BMI) 39.0-39.9, adult: Secondary | ICD-10-CM | POA: Diagnosis not present

## 2023-04-03 DIAGNOSIS — K5900 Constipation, unspecified: Secondary | ICD-10-CM | POA: Diagnosis not present

## 2023-04-03 DIAGNOSIS — E559 Vitamin D deficiency, unspecified: Secondary | ICD-10-CM | POA: Diagnosis not present

## 2023-04-03 DIAGNOSIS — R635 Abnormal weight gain: Secondary | ICD-10-CM | POA: Diagnosis not present

## 2023-04-03 DIAGNOSIS — Z1339 Encounter for screening examination for other mental health and behavioral disorders: Secondary | ICD-10-CM | POA: Diagnosis not present

## 2023-04-03 DIAGNOSIS — E039 Hypothyroidism, unspecified: Secondary | ICD-10-CM | POA: Diagnosis not present

## 2023-04-03 DIAGNOSIS — E611 Iron deficiency: Secondary | ICD-10-CM | POA: Diagnosis not present

## 2023-04-03 DIAGNOSIS — Z1331 Encounter for screening for depression: Secondary | ICD-10-CM | POA: Diagnosis not present

## 2023-04-03 DIAGNOSIS — Z6841 Body Mass Index (BMI) 40.0 and over, adult: Secondary | ICD-10-CM | POA: Diagnosis not present

## 2023-04-03 DIAGNOSIS — F900 Attention-deficit hyperactivity disorder, predominantly inattentive type: Secondary | ICD-10-CM | POA: Diagnosis not present

## 2023-04-03 DIAGNOSIS — F3341 Major depressive disorder, recurrent, in partial remission: Secondary | ICD-10-CM | POA: Diagnosis not present

## 2023-04-03 DIAGNOSIS — N951 Menopausal and female climacteric states: Secondary | ICD-10-CM | POA: Diagnosis not present

## 2023-04-04 DIAGNOSIS — F411 Generalized anxiety disorder: Secondary | ICD-10-CM | POA: Diagnosis not present

## 2023-04-04 DIAGNOSIS — F331 Major depressive disorder, recurrent, moderate: Secondary | ICD-10-CM | POA: Diagnosis not present

## 2023-04-04 DIAGNOSIS — F902 Attention-deficit hyperactivity disorder, combined type: Secondary | ICD-10-CM | POA: Diagnosis not present

## 2023-04-12 DIAGNOSIS — Z6841 Body Mass Index (BMI) 40.0 and over, adult: Secondary | ICD-10-CM | POA: Diagnosis not present

## 2023-04-12 DIAGNOSIS — R7303 Prediabetes: Secondary | ICD-10-CM | POA: Diagnosis not present

## 2023-04-19 DIAGNOSIS — Z6841 Body Mass Index (BMI) 40.0 and over, adult: Secondary | ICD-10-CM | POA: Diagnosis not present

## 2023-04-19 DIAGNOSIS — F411 Generalized anxiety disorder: Secondary | ICD-10-CM | POA: Diagnosis not present

## 2023-04-25 DIAGNOSIS — R590 Localized enlarged lymph nodes: Secondary | ICD-10-CM | POA: Diagnosis not present

## 2023-04-25 DIAGNOSIS — Z1231 Encounter for screening mammogram for malignant neoplasm of breast: Secondary | ICD-10-CM | POA: Diagnosis not present

## 2023-04-25 DIAGNOSIS — Z113 Encounter for screening for infections with a predominantly sexual mode of transmission: Secondary | ICD-10-CM | POA: Diagnosis not present

## 2023-04-25 DIAGNOSIS — R7303 Prediabetes: Secondary | ICD-10-CM | POA: Diagnosis not present

## 2023-04-25 DIAGNOSIS — Z0000 Encounter for general adult medical examination without abnormal findings: Secondary | ICD-10-CM | POA: Diagnosis not present

## 2023-04-25 DIAGNOSIS — E559 Vitamin D deficiency, unspecified: Secondary | ICD-10-CM | POA: Diagnosis not present

## 2023-04-25 DIAGNOSIS — F988 Other specified behavioral and emotional disorders with onset usually occurring in childhood and adolescence: Secondary | ICD-10-CM | POA: Diagnosis not present

## 2023-04-25 DIAGNOSIS — K5904 Chronic idiopathic constipation: Secondary | ICD-10-CM | POA: Diagnosis not present

## 2023-05-04 DIAGNOSIS — E611 Iron deficiency: Secondary | ICD-10-CM | POA: Diagnosis not present

## 2023-05-04 DIAGNOSIS — F900 Attention-deficit hyperactivity disorder, predominantly inattentive type: Secondary | ICD-10-CM | POA: Diagnosis not present

## 2023-05-04 DIAGNOSIS — E559 Vitamin D deficiency, unspecified: Secondary | ICD-10-CM | POA: Diagnosis not present

## 2023-05-04 DIAGNOSIS — Z6839 Body mass index (BMI) 39.0-39.9, adult: Secondary | ICD-10-CM | POA: Diagnosis not present

## 2023-05-04 DIAGNOSIS — R7303 Prediabetes: Secondary | ICD-10-CM | POA: Diagnosis not present

## 2023-05-04 DIAGNOSIS — E039 Hypothyroidism, unspecified: Secondary | ICD-10-CM | POA: Diagnosis not present

## 2023-05-04 DIAGNOSIS — F3341 Major depressive disorder, recurrent, in partial remission: Secondary | ICD-10-CM | POA: Diagnosis not present

## 2023-05-11 DIAGNOSIS — E785 Hyperlipidemia, unspecified: Secondary | ICD-10-CM | POA: Diagnosis not present

## 2023-05-11 DIAGNOSIS — Z6838 Body mass index (BMI) 38.0-38.9, adult: Secondary | ICD-10-CM | POA: Diagnosis not present

## 2023-05-18 DIAGNOSIS — E039 Hypothyroidism, unspecified: Secondary | ICD-10-CM | POA: Diagnosis not present

## 2023-05-18 DIAGNOSIS — E611 Iron deficiency: Secondary | ICD-10-CM | POA: Diagnosis not present

## 2023-05-18 DIAGNOSIS — Z6839 Body mass index (BMI) 39.0-39.9, adult: Secondary | ICD-10-CM | POA: Diagnosis not present

## 2023-05-18 DIAGNOSIS — R7303 Prediabetes: Secondary | ICD-10-CM | POA: Diagnosis not present

## 2023-05-25 DIAGNOSIS — F411 Generalized anxiety disorder: Secondary | ICD-10-CM | POA: Diagnosis not present

## 2023-05-25 DIAGNOSIS — F331 Major depressive disorder, recurrent, moderate: Secondary | ICD-10-CM | POA: Diagnosis not present

## 2023-05-25 DIAGNOSIS — F902 Attention-deficit hyperactivity disorder, combined type: Secondary | ICD-10-CM | POA: Diagnosis not present

## 2023-06-02 DIAGNOSIS — R7303 Prediabetes: Secondary | ICD-10-CM | POA: Diagnosis not present

## 2023-06-02 DIAGNOSIS — Z6838 Body mass index (BMI) 38.0-38.9, adult: Secondary | ICD-10-CM | POA: Diagnosis not present

## 2023-06-20 DIAGNOSIS — E785 Hyperlipidemia, unspecified: Secondary | ICD-10-CM | POA: Diagnosis not present

## 2023-06-20 DIAGNOSIS — Z6838 Body mass index (BMI) 38.0-38.9, adult: Secondary | ICD-10-CM | POA: Diagnosis not present

## 2023-06-22 DIAGNOSIS — F411 Generalized anxiety disorder: Secondary | ICD-10-CM | POA: Diagnosis not present

## 2023-06-22 DIAGNOSIS — F331 Major depressive disorder, recurrent, moderate: Secondary | ICD-10-CM | POA: Diagnosis not present

## 2023-06-22 DIAGNOSIS — F902 Attention-deficit hyperactivity disorder, combined type: Secondary | ICD-10-CM | POA: Diagnosis not present

## 2023-07-13 DIAGNOSIS — E559 Vitamin D deficiency, unspecified: Secondary | ICD-10-CM | POA: Diagnosis not present

## 2023-07-13 DIAGNOSIS — Z6838 Body mass index (BMI) 38.0-38.9, adult: Secondary | ICD-10-CM | POA: Diagnosis not present

## 2023-07-13 DIAGNOSIS — E785 Hyperlipidemia, unspecified: Secondary | ICD-10-CM | POA: Diagnosis not present

## 2023-07-27 DIAGNOSIS — Z6838 Body mass index (BMI) 38.0-38.9, adult: Secondary | ICD-10-CM | POA: Diagnosis not present

## 2023-07-27 DIAGNOSIS — R7303 Prediabetes: Secondary | ICD-10-CM | POA: Diagnosis not present

## 2023-08-03 DIAGNOSIS — E039 Hypothyroidism, unspecified: Secondary | ICD-10-CM | POA: Diagnosis not present

## 2023-08-03 DIAGNOSIS — Z6837 Body mass index (BMI) 37.0-37.9, adult: Secondary | ICD-10-CM | POA: Diagnosis not present

## 2023-08-07 DIAGNOSIS — F331 Major depressive disorder, recurrent, moderate: Secondary | ICD-10-CM | POA: Diagnosis not present

## 2023-08-07 DIAGNOSIS — F411 Generalized anxiety disorder: Secondary | ICD-10-CM | POA: Diagnosis not present

## 2023-08-07 DIAGNOSIS — F902 Attention-deficit hyperactivity disorder, combined type: Secondary | ICD-10-CM | POA: Diagnosis not present

## 2023-08-17 DIAGNOSIS — E039 Hypothyroidism, unspecified: Secondary | ICD-10-CM | POA: Diagnosis not present

## 2023-08-17 DIAGNOSIS — Z6837 Body mass index (BMI) 37.0-37.9, adult: Secondary | ICD-10-CM | POA: Diagnosis not present

## 2023-09-04 DIAGNOSIS — E559 Vitamin D deficiency, unspecified: Secondary | ICD-10-CM | POA: Diagnosis not present

## 2023-09-04 DIAGNOSIS — E039 Hypothyroidism, unspecified: Secondary | ICD-10-CM | POA: Diagnosis not present

## 2023-09-04 DIAGNOSIS — Z6838 Body mass index (BMI) 38.0-38.9, adult: Secondary | ICD-10-CM | POA: Diagnosis not present

## 2023-10-12 DIAGNOSIS — E039 Hypothyroidism, unspecified: Secondary | ICD-10-CM | POA: Diagnosis not present

## 2023-10-12 DIAGNOSIS — Z6837 Body mass index (BMI) 37.0-37.9, adult: Secondary | ICD-10-CM | POA: Diagnosis not present

## 2023-10-12 DIAGNOSIS — E785 Hyperlipidemia, unspecified: Secondary | ICD-10-CM | POA: Diagnosis not present

## 2023-10-27 DIAGNOSIS — R7303 Prediabetes: Secondary | ICD-10-CM | POA: Diagnosis not present

## 2023-10-27 DIAGNOSIS — Z6836 Body mass index (BMI) 36.0-36.9, adult: Secondary | ICD-10-CM | POA: Diagnosis not present

## 2023-10-27 DIAGNOSIS — E039 Hypothyroidism, unspecified: Secondary | ICD-10-CM | POA: Diagnosis not present

## 2023-10-31 DIAGNOSIS — K5904 Chronic idiopathic constipation: Secondary | ICD-10-CM | POA: Diagnosis not present

## 2023-10-31 DIAGNOSIS — F322 Major depressive disorder, single episode, severe without psychotic features: Secondary | ICD-10-CM | POA: Diagnosis not present

## 2023-10-31 DIAGNOSIS — F988 Other specified behavioral and emotional disorders with onset usually occurring in childhood and adolescence: Secondary | ICD-10-CM | POA: Diagnosis not present

## 2023-10-31 DIAGNOSIS — F419 Anxiety disorder, unspecified: Secondary | ICD-10-CM | POA: Diagnosis not present

## 2023-11-10 DIAGNOSIS — E039 Hypothyroidism, unspecified: Secondary | ICD-10-CM | POA: Diagnosis not present

## 2023-11-10 DIAGNOSIS — Z6837 Body mass index (BMI) 37.0-37.9, adult: Secondary | ICD-10-CM | POA: Diagnosis not present

## 2023-12-01 DIAGNOSIS — Z6836 Body mass index (BMI) 36.0-36.9, adult: Secondary | ICD-10-CM | POA: Diagnosis not present

## 2023-12-01 DIAGNOSIS — E611 Iron deficiency: Secondary | ICD-10-CM | POA: Diagnosis not present

## 2023-12-11 DIAGNOSIS — F902 Attention-deficit hyperactivity disorder, combined type: Secondary | ICD-10-CM | POA: Diagnosis not present

## 2023-12-11 DIAGNOSIS — F331 Major depressive disorder, recurrent, moderate: Secondary | ICD-10-CM | POA: Diagnosis not present

## 2023-12-11 DIAGNOSIS — F411 Generalized anxiety disorder: Secondary | ICD-10-CM | POA: Diagnosis not present

## 2024-01-09 DIAGNOSIS — R0683 Snoring: Secondary | ICD-10-CM | POA: Diagnosis not present

## 2024-01-09 DIAGNOSIS — L309 Dermatitis, unspecified: Secondary | ICD-10-CM | POA: Diagnosis not present

## 2024-01-09 DIAGNOSIS — R400 Somnolence: Secondary | ICD-10-CM | POA: Diagnosis not present

## 2024-01-09 DIAGNOSIS — Z0189 Encounter for other specified special examinations: Secondary | ICD-10-CM | POA: Diagnosis not present

## 2024-01-09 DIAGNOSIS — E669 Obesity, unspecified: Secondary | ICD-10-CM | POA: Diagnosis not present

## 2024-03-11 DIAGNOSIS — Z6835 Body mass index (BMI) 35.0-35.9, adult: Secondary | ICD-10-CM | POA: Diagnosis not present

## 2024-03-11 DIAGNOSIS — R0683 Snoring: Secondary | ICD-10-CM | POA: Diagnosis not present
